# Patient Record
Sex: Male | Born: 1965
Health system: Southern US, Community
[De-identification: ages and names within clinical notes are randomized; demographics above are authoritative.]

## PROBLEM LIST (undated history)

## (undated) DIAGNOSIS — E119 Type 2 diabetes mellitus without complications: Secondary | ICD-10-CM

## (undated) DIAGNOSIS — M869 Osteomyelitis, unspecified: Secondary | ICD-10-CM

## (undated) HISTORY — DX: Type 2 diabetes mellitus without complications: E11.9

## (undated) HISTORY — DX: Osteomyelitis, unspecified: M86.9

---

## 2007-06-15 IMAGING — CR PA
1 series · 1 of 1 positions shown · non-contrast
Comparison: none

NAME: LEWACK, MATHEBULA

REASON: CHF N/A
PA AND LATERAL CHEST
The current exam is compared to the prior exam of yesterday with no
significant interval changes seen. A LEFT pleural effusion is again
noted. The effusion may be slightly less prominent but any change is
minimal. No new pulmonary infiltrates are identified. Cardiomegaly is
again seen.

[view not recorded]
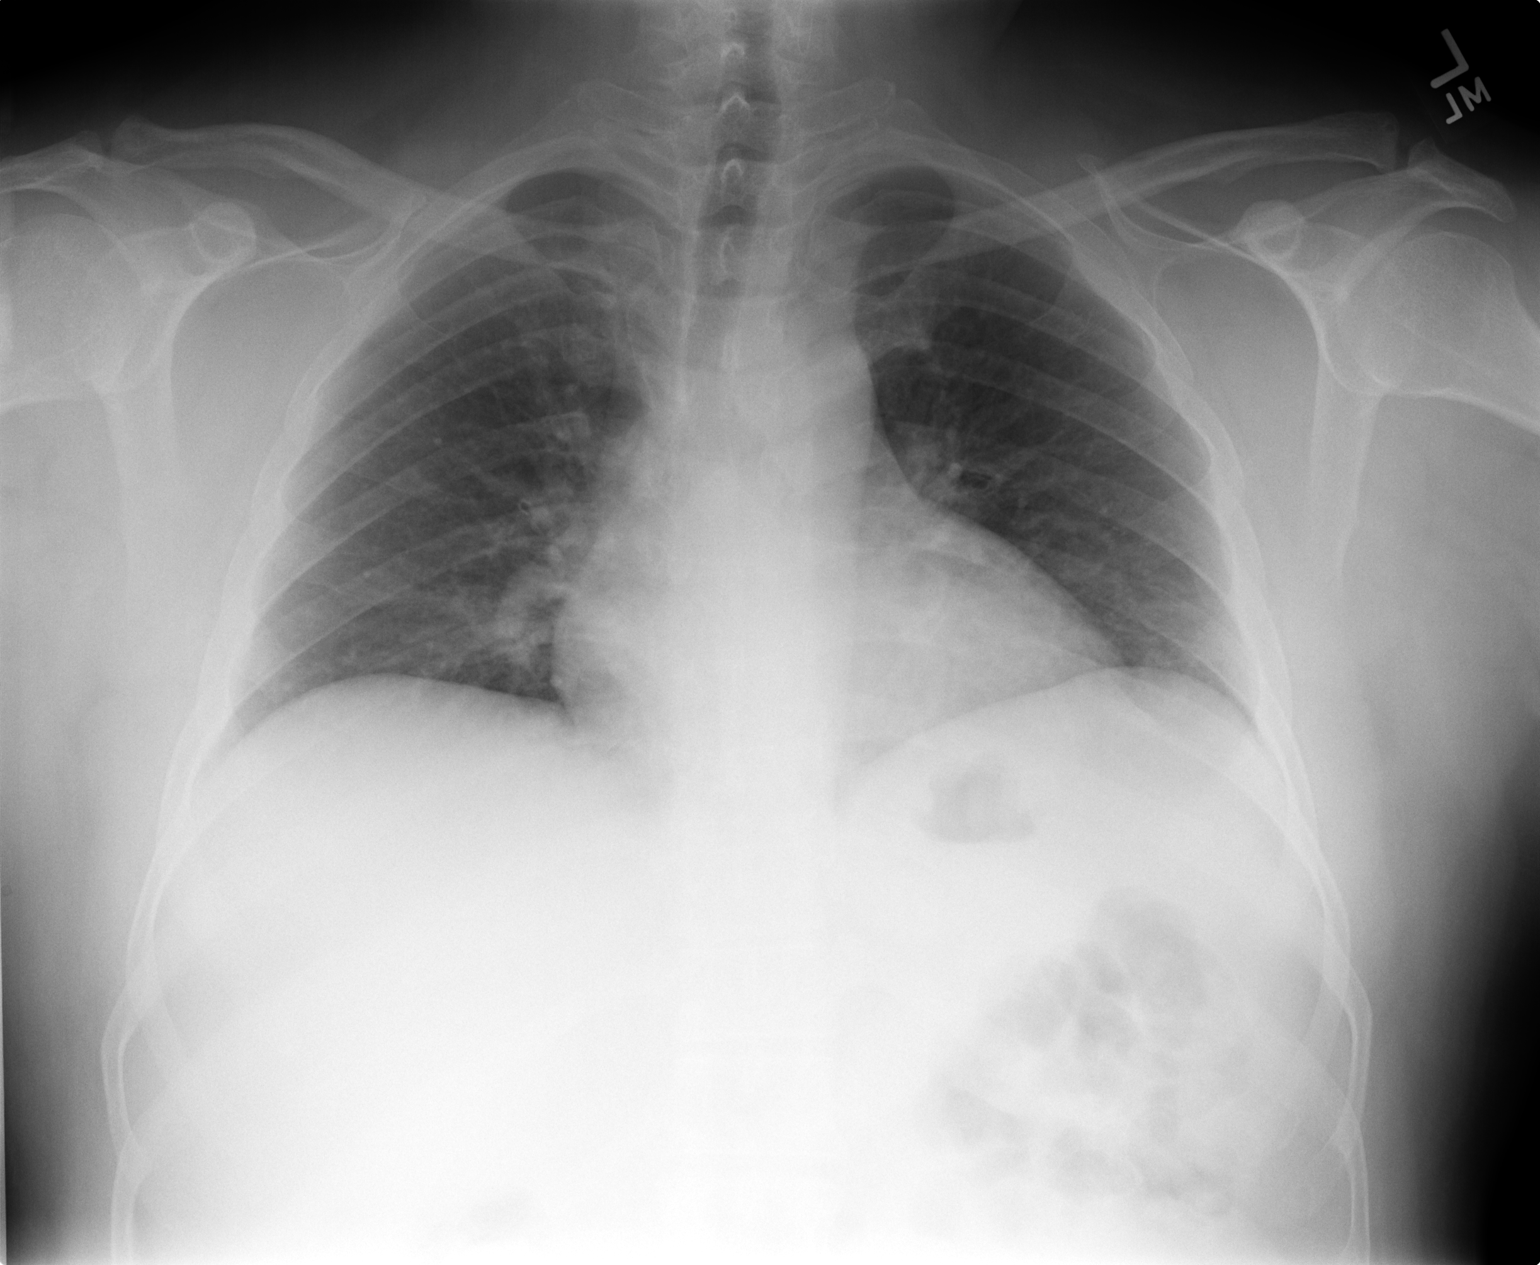

[1 of 1 positions shown; findings below may reference images not displayed]

CONCLUSION: 1. No new pulmonary infiltrates are identified as compared to the exam
of yesterday.

2. Persistent LEFT pleural effusion which appears slightly less
prominent than on the prior exam.

NAME: LEWACK, MATHEBULA

3. Persistent cardiomegaly. The general appearance of the chest
suggests low-grade congestive failure.

## 2013-10-04 ENCOUNTER — Ambulatory Visit (INDEPENDENT_AMBULATORY_CARE_PROVIDER_SITE_OTHER): Payer: BC Managed Care – PPO | Admitting: Family Medicine

## 2013-10-04 VITALS — BP 122/72 | HR 70 | Temp 97.6°F | Resp 16 | Ht 63.75 in | Wt 190.6 lb

## 2013-10-04 DIAGNOSIS — E119 Type 2 diabetes mellitus without complications: Secondary | ICD-10-CM

## 2013-10-04 DIAGNOSIS — Z125 Encounter for screening for malignant neoplasm of prostate: Secondary | ICD-10-CM

## 2013-10-04 DIAGNOSIS — Z0000 Encounter for general adult medical examination without abnormal findings: Secondary | ICD-10-CM

## 2013-10-04 DIAGNOSIS — Z130 Encounter for screening for diseases of the blood and blood-forming organs and certain disorders involving the immune mechanism: Secondary | ICD-10-CM

## 2013-10-04 DIAGNOSIS — Z1211 Encounter for screening for malignant neoplasm of colon: Secondary | ICD-10-CM

## 2013-10-04 DIAGNOSIS — Z13220 Encounter for screening for lipoid disorders: Secondary | ICD-10-CM

## 2013-10-04 LAB — POCT CBC
Granulocyte percent: 56 % (ref 37–80)
HCT, POC: 48 % (ref 43.5–53.7)
Hemoglobin: 16.4 g/dL (ref 14.1–18.1)
Lymph, poc: 3 (ref 0.6–3.4)
MCH, POC: 30.4 pg (ref 27–31.2)
MCHC: 34.2 g/dL (ref 31.8–35.4)
MCV: 88.8 fL (ref 80–97)
MID (cbc): 0.5 (ref 0–0.9)
MPV: 8.7 fL (ref 0–99.8)
POC Granulocyte: 4.4 (ref 2–6.9)
POC LYMPH PERCENT: 38.3 %L (ref 10–50)
POC MID %: 5.7 % (ref 0–12)
Platelet Count, POC: 204 10*3/uL (ref 142–424)
RBC: 5.41 M/uL (ref 4.69–6.13)
RDW, POC: 12.1 %
WBC: 7.9 10*3/uL (ref 4.6–10.2)

## 2013-10-04 LAB — POCT GLYCOSYLATED HEMOGLOBIN (HGB A1C): Hemoglobin A1C: 10.2

## 2013-10-04 MED ORDER — METFORMIN HCL 1000 MG PO TABS: 1000.0000 mg | ORAL_TABLET | Freq: Two times a day (BID) | ORAL | Status: DC

## 2013-10-04 NOTE — Progress Notes (Signed)
Chief Complaint:  Chief Complaint  Patient presents with  . Annual Exam    HPI: Ethan [name] is a 48 y.o. male who is here for  CPE He has had DM for 5 years and is on metformin 500 mg BID  He is doing well no SEs, no neuropathy, he has not seen a eye doctor  He has no complaints.  HE had a tetanus vaccine in 2010 HE ahs never had colonscopy.    Past Medical History  Diagnosis Date  . Diabetes mellitus without complication    History reviewed. No pertinent past surgical history. History   Social History  . Marital Status: Married    Spouse Name: N/A    Number of Children: N/A  . Years of Education: N/A   Social History Main Topics  . Smoking status: Never Smoker   . Smokeless tobacco: Never Used  . Alcohol Use: No  . Drug Use: No  . Sexual Activity: None   Other Topics Concern  . None   Social History Narrative  . None   Family History  Problem Relation Age of Onset  . Cancer Father    No Known Allergies Prior to Admission medications   Medication Sig Start Date End Date Taking? Authorizing Provider  metFORMIN (GLUCOPHAGE) 500 MG tablet Take 500 mg by mouth 2 (two) times daily with a meal.   Yes Historical Provider, MD     ROS: The patient denies fevers, chills, night sweats, unintentional weight loss, chest pain, palpitations, wheezing, dyspnea on exertion, nausea, vomiting, abdominal pain, dysuria, hematuria, melena, numbness, weakness, or tingling.   All other systems have been reviewed and were otherwise negative with the exception of those mentioned in the HPI and as above.    PHYSICAL EXAM: Filed Vitals:   10/04/13 1004  BP: 122/72  Pulse: 70  Temp: 97.6 F (36.4 C)  Resp: 16   Filed Vitals:   10/04/13 1004  Height: 5' 3.75" (1.619 m)  Weight: 190 lb 9.6 oz (86.456 kg)   Body mass index is 32.98 kg/(m^2).  General: Alert, no acute distress HEENT:  Normocephalic, atraumatic, oropharynx patent. EOMI, PERRLA, fundo exam  normal Cardiovascular:  Regular rate and rhythm, no rubs murmurs or gallops.  No Carotid bruits, radial pulse intact. No pedal edema.  Respiratory: Clear to auscultation bilaterally.  No wheezes, rales, or rhonchi.  No cyanosis, no use of accessory musculature GI: No organomegaly, abdomen is soft and non-tender, positive bowel sounds.  No masses. Skin: No rashes. Neurologic: Facial musculature symmetric. Psychiatric: Patient is appropriate throughout our interaction. Lymphatic: No cervical lymphadenopathy Musculoskeletal: Gait intact. GU-testicular and prostate exam normal  I deferred hemosure sicne will refer him for colonscopy   LABS: Results for orders placed in visit on 10/04/13  POCT CBC      Result Value Ref Range   WBC 7.9  4.6 - 10.2 K/uL   Lymph, poc 3.0  0.6 - 3.4   POC LYMPH PERCENT 38.3  10 - 50 %L   MID (cbc) 0.5  0 - 0.9   POC MID % 5.7  0 - 12 %M   POC Granulocyte 4.4  2 - 6.9   Granulocyte percent 56.0  37 - 80 %G   RBC 5.41  4.69 - 6.13 M/uL   Hemoglobin 16.4  14.1 - 18.1 g/dL   HCT, POC 16.148.0  09.643.5 - 53.7 %   MCV 88.8  80 - 97 fL   MCH, POC 30.4  27 - 31.2 pg   MCHC 34.2  31.8 - 35.4 g/dL   RDW, POC 16.1     Platelet Count, POC 204  142 - 424 K/uL   MPV 8.7  0 - 99.8 fL  POCT GLYCOSYLATED HEMOGLOBIN (HGB A1C)      Result Value Ref Range   Hemoglobin A1C 10.2       EKG/XRAY:   Primary read interpreted by Dr. Conley Rolls at [facility]   ASSESSMENT/PLAN: Encounter Diagnoses  Name Primary?  . Annual physical exam Yes  . Type II or unspecified type diabetes mellitus without mention of complication, not stated as uncontrolled   . Screening for hyperlipidemia   . Screening for deficiency anemia   . Screening for colon cancer   . Screening for prostate cancer    Annual labs pending Refer to GI for colonscopy Will increase his metfromin from 500 mg BID to 1 gram BID due to increase A1c Recommend : ADA diet, BP goal <140/90, daily foot exams, tobacco cessation if  smoking, annual eye exam, annual flu vaccine, PNA vaccine if age and time appropriate. . F/u in 3 month  Gross sideeffects, risk and benefits, and alternatives of medications d/w patient. Patient is aware that all medications have potential sideeffects and we are unable to predict every sideeffect or drug-drug interaction that may occur.  Hamilton Capri PHUONG, DO 10/04/2013 12:27 PM

## 2013-10-04 NOTE — Patient Instructions (Signed)
Diabetes y normas bsicas de atencin mdica (Diabetes and Standards of Medical Care) La diabetes es una enfermedad complicada. El equipo que trate su diabetes deber incluir un nutricionista, un enfermero, un educador para la diabetes, un oftalmlogo y ms. Para que todas las personas conozcan sobre su enfermedad y para que los pacientes tengan los cuidados que necesitan, se crearon las siguientes normas bsicas para un mejor control. A continuacin se indican los estudios, vacunas, medicamentos, educacin y planes que necesitar. Prueba de HbA1c Esta prueba muestra cmo ha sido controlada su glucosa en los ltimos 2 o 3 meses. Se utiliza para verificar si el plan de control de la diabetes debe ser ajustado.   Hgalos al menos 2 veces al ao si cumple los objetivos del tratamiento.  Si Alexi Geibel han cambiado el tratamiento o si no cumple con los objetivos del tratamiento, debe hacerlo 4 veces al ao. Control de la presin arterial.  Hgalas en cada visita mdica de rutina. El objetivo es tener menos de 140/90 mm Hg en la mayora de las personas, pero 130/80 mm Hg en algunos casos. Consulte a su mdico acerca de su objetivo. Examen dental.  Concurra regularmente a las visitas de control con el dentista. Examen ocular.  Si Oreatha Fabry diagnosticaron diabetes tipo 1 siendo un nio, debe hacerse estudios al llegar a los 10 aos o ms y si ha sufrido de diabetes durante 3 a 5 aos. Se recomienda hacer anualmente los exmenes oculares despus de ese examen inicial.  Si Golden Gilreath diagnosticaron diabetes tipo 1 siendo adulto, hgase un examen dentro de los 5 aos del diagnstico y luego una vez por ao.  Si Sheketa Ende diagnosticaron diabetes tipo 2, hgase un estudio lo antes posible despus del diagnstico y luego una vez por ao. Examen de los pies  Se har una inspeccin visual en cada visita mdica de rutina. Estos controles observarn si hay cortes, lesiones u otros problemas en los pies.  Una vez por ao debe hacerse un  examen ms intensivo. Este examen incluye la inspeccin visual y una evaluacin de los pulsos del pie y la sensibilidad.  Contrlese los pies todas las noches para ver si hay cortes, lesiones u otros problemas. Comunquese con su mdico si observa que no se curan. Estudio de la funcin renal (microalbmina en orina)  Debe realizarse una vez por ao.  Diabetes tipo 1: La primera prueba se realiza a los 5 aos despus del diagnstico.  Diabetes tipo2: La primera prueba se realiza en el momento del diagnstico.  La creatinina srica y el ndice de filtracin glomerular estimada (eGFR, por sus siglas en ingls) se realizan una vez por ao para informar el nivel de enfermedad renal crnica, si la hubiera. Perfil lipdico (colesterol, HDL, LDL, triglicridos).  La mayora de las personas lo hacen cada 5 aos.  En relacin al LDL, el objetivo es tener menos de 100 mg/dl. Si tiene alto riesgo, el objetivo es tener menos de 70 mg/dl.  En relacin al HDL, el objetivo es tener entre 40 y 50 mg/dl para los hombres y entre 50 y 60 mg/dl para las mujeres. Un nivel de colesterol HDL de 60 mg/dl o superior da una cierta proteccin contra la enfermedad cardaca.  En relacin a los triglicridos, el objetivo es tener menos de 150 mg/dl. Vacuna contra la gripe, contra el neumococo y contra la hepatitis B  Se recomienda aplicarse la vacuna contra la gripe anualmente.  Se recomienda que las personas mayores de 65aos con diabetes se apliquen la   vacuna contra la neumona. En algunos casos, pueden administrarse dos inyecciones separadas. Pregntele al mdico si tiene la vacuna contra la neumona al da.  Tambin se recomienda la aplicacin de la vacuna contra la hepatitis B en los adultos con diabetes. Educacin para el autocontrol de la diabetes  Recomendaciones al momento del diagnstico y los controles segn sea necesario. Plan de tratamiento  Su plan de tratamiento ser revisado en cada visita  mdica. Document Released: 05/15/2009 Document Revised: 07/05/2013 [facility] Patient Information 2015 [place]. This information is not intended to replace advice given to you by your health care provider. Make sure you discuss any questions you have with your health care provider.

## 2013-10-05 LAB — COMPLETE METABOLIC PANEL WITH GFR
ALT: 18 U/L (ref 0–53)
BUN: 13 mg/dL (ref 6–23)
CO2: 16 mEq/L — ABNORMAL LOW (ref 19–32)
Chloride: 98 mEq/L (ref 96–112)
Creat: 0.7 mg/dL (ref 0.50–1.35)
GFR, Est Non African American: >89 mL/min
Glucose, Bld: 291 mg/dL — ABNORMAL HIGH (ref 70–99)
Potassium: 4.5 mEq/L (ref 3.5–5.3)
Total Protein: 6.9 g/dL (ref 6.0–8.3)

## 2013-10-05 LAB — LIPID PANEL
Cholesterol: 265 mg/dL — ABNORMAL HIGH (ref 0–200)
HDL: 33 mg/dL — ABNORMAL LOW (ref >39)
Total CHOL/HDL Ratio: 8 Ratio
Triglycerides: 1151 mg/dL — ABNORMAL HIGH (ref <150)

## 2013-10-05 LAB — COMPLETE METABOLIC PANEL WITHOUT GFR
AST: 13 U/L (ref 0–37)
Albumin: 4.1 g/dL (ref 3.5–5.2)
Alkaline Phosphatase: 70 U/L (ref 39–117)
Calcium: 9.6 mg/dL (ref 8.4–10.5)
GFR, Est African American: >89 mL/min
Sodium: 133 meq/L — ABNORMAL LOW (ref 135–145)
Total Bilirubin: 0.6 mg/dL (ref 0.2–1.2)

## 2013-10-05 LAB — TSH: TSH: 1.803 u[IU]/mL (ref 0.350–4.500)

## 2013-10-05 LAB — PSA: PSA: 0.16 ng/mL (ref <=4.00)

## 2013-10-06 ENCOUNTER — Telehealth: Payer: Self-pay | Admitting: Family Medicine

## 2013-10-06 ENCOUNTER — Encounter: Payer: Self-pay | Admitting: Family Medicine

## 2013-10-06 MED ORDER — SIMVASTATIN 40 MG PO TABS: 40.0000 mg | ORAL_TABLET | Freq: Every day | ORAL | Status: DC

## 2013-10-06 NOTE — Telephone Encounter (Signed)
[phone] is his preferred number, his wife gave it to me  D/w patient his recent labs. Will return in 2-3 months for cholesterol and diabetes recheck.  Will start him on simvastatin 40 mg daily

## 2014-01-21 ENCOUNTER — Ambulatory Visit (INDEPENDENT_AMBULATORY_CARE_PROVIDER_SITE_OTHER): Payer: BC Managed Care – PPO | Admitting: Family Medicine

## 2014-01-21 ENCOUNTER — Encounter: Payer: Self-pay | Admitting: Family Medicine

## 2014-01-21 VITALS — BP 122/78 | HR 84 | Temp 98.2°F | Resp 16 | Ht 63.25 in | Wt 196.4 lb

## 2014-01-21 DIAGNOSIS — E785 Hyperlipidemia, unspecified: Secondary | ICD-10-CM

## 2014-01-21 DIAGNOSIS — N529 Male erectile dysfunction, unspecified: Secondary | ICD-10-CM

## 2014-01-21 DIAGNOSIS — E118 Type 2 diabetes mellitus with unspecified complications: Secondary | ICD-10-CM

## 2014-01-21 LAB — GLUCOSE, POCT (MANUAL RESULT ENTRY): POC Glucose: 186 mg/dL — AB (ref 70–99)

## 2014-01-21 LAB — LIPID PANEL
CHOLESTEROL: 161 mg/dL (ref 0–200)
HDL: 25 mg/dL — AB (ref >39)
Total CHOL/HDL Ratio: 6.4 Ratio
Triglycerides: 772 mg/dL — ABNORMAL HIGH (ref <150)

## 2014-01-21 LAB — COMPREHENSIVE METABOLIC PANEL
ALT: 17 U/L (ref 0–53)
AST: 12 U/L (ref 0–37)
Albumin: 4.2 g/dL (ref 3.5–5.2)
Alkaline Phosphatase: 60 U/L (ref 39–117)
BILIRUBIN TOTAL: 0.2 mg/dL (ref 0.2–1.2)
BUN: 15 mg/dL (ref 6–23)
CALCIUM: 8.9 mg/dL (ref 8.4–10.5)
CHLORIDE: 103 meq/L (ref 96–112)
CO2: 27 meq/L (ref 19–32)
Creat: 0.82 mg/dL (ref 0.50–1.35)
Glucose, Bld: 162 mg/dL — ABNORMAL HIGH (ref 70–99)
Potassium: 4.4 mEq/L (ref 3.5–5.3)
Sodium: 138 mEq/L (ref 135–145)
Total Protein: 6.8 g/dL (ref 6.0–8.3)

## 2014-01-21 LAB — POCT GLYCOSYLATED HEMOGLOBIN (HGB A1C): Hemoglobin A1C: 6.5

## 2014-01-21 MED ORDER — SIMVASTATIN 40 MG PO TABS: 40.0000 mg | ORAL_TABLET | Freq: Every day | ORAL | Status: DC

## 2014-01-21 MED ORDER — SILDENAFIL CITRATE 100 MG PO TABS: ORAL_TABLET | ORAL | Status: DC

## 2014-01-21 NOTE — Progress Notes (Addendum)
IDENTIFYING INFORMATION  Ethan [name] / DOB: 1965-12-04 / MRN: [id number]  The patient  does not have a problem list on [name].  SUBJECTIVE  Chief Complaint: Follow-up   History of present illness: Ethan [name] is a 48 y.o. year old male who presents for a diabetes and cholesterol followup.  He has been taking Metformin 1 gram in the morning and 1 gram. He denies urinary frequency and urgency at this time, and reports he has lost roughly 5 lbs since his last visit. He denies sock and glove paraesthesia.  He has been taking 40 mg of simvastatin nightly.  He denies muscular pain and RUQ pain.  He reports erectile dysfunction n that he can not achieve an erection for the last eight months. He reports that his relationship with his wife is good.  He denies urinary difficulty, and reports that past prostate exams have been "good."  He  has a past medical history of Diabetes mellitus without complication.    He has a current medication list which includes the following prescription(s): metformin and simvastatin.  Ethan [name] has No Known Allergies. He  reports that he has never smoked. He has never used smokeless tobacco. He reports that he does not drink alcohol or use illicit drugs. He  has no sexual activity history on [name].  The patient  has no past surgical history on [name].  His family history includes Cancer in his father.  Review of Systems  Constitutional: Negative.   HENT: Negative.   Eyes: Negative.   Respiratory: Negative.   Cardiovascular: Negative.   Gastrointestinal: Negative.   Genitourinary: Negative.   Musculoskeletal: Negative for myalgias.  Skin: Negative.   Neurological: Negative for dizziness.  Endo/Heme/Allergies: Negative for polydipsia.  Psychiatric/Behavioral: Negative.     OBJECTIVE  Blood pressure 122/78, pulse 84, temperature 98.2 F (36.8 C), temperature source Oral, resp. rate 16, height 5' 3.25" (1.607 m), weight 196 lb 6.4 oz (89.086 kg), SpO2 99  %. The patient's body mass index is 34.5 kg/(m^2).  Physical Exam  Constitutional: He is oriented to person, place, and time. He appears well-developed and well-nourished.  HENT:  Head: Normocephalic.  Right Ear: External ear normal.  Left Ear: External ear normal.  Mouth/Throat: No oropharyngeal exudate.  Eyes: Conjunctivae and EOM are normal. Pupils are equal, round, and reactive to light.  Neck: Normal range of motion.  Cardiovascular: Normal rate, regular rhythm and normal heart sounds.  Exam reveals no gallop and no friction rub.   No murmur heard. Respiratory: Effort normal and breath sounds normal.  GI: Soft. Bowel sounds are normal.  Musculoskeletal: Normal range of motion.  Neurological: He is alert and oriented to person, place, and time. He has normal reflexes.  Skin: Skin is warm, dry and intact.  Psychiatric: He has a normal mood and affect. His behavior is normal. Judgment and thought content normal.   Monofilament testing: Negative for sensation deficits.  Results for Ethan [name], Ethan [name] (MRN [id number]) as of 01/21/2014 16:31  Ref. Range 10/04/2013 11:28 01/21/2014 16:27  Hgb A1c MFr Bld No range found 10.2 6.5    ASSESSMENT & PLAN  Ethan [name] was seen today for follow-up.  Diagnoses and associated orders for this visit:  Diabetes mellitus type 2 with complications - POCT glucose (manual entry) - POCT glycosylated hemoglobin (Hb A1C) - Ambulatory referral to Ophthalmology (Addendum on 02/08/2014: Patient has not picked up the phone when referral department calls, and no voicemail has been set up.  Therefore no appointment has  been made.  This should be discussed at next follow up.)   Dyslipidemia - simvastatin (ZOCOR) 40 MG tablet; Take 1 tablet (40 mg total) by mouth at bedtime. Return in 2.5 months for recheck. - Comprehensive metabolic panel - Lipid panel  Erectile dysfunction, unspecified erectile dysfunction type - sildenafil (VIAGRA) 100 MG tablet; Take 30 minutes  to an hour before sex.  Monitor for low blood pressure.     The patient was instructed to to call or comeback to clinic as needed, or should symptoms warrant.  Deliah BostonMichael Clark, MHS, PA-C [facility] 01/21/2014 5:13 PM

## 2014-01-24 NOTE — Progress Notes (Signed)
Agree with A/p.  Teststerone level at this point would be inaccurate since need to be drawn early AM If  he has concerns for hypogonadism related to low Testosterone level then will need to come in for blood work in the AM for better accuracy.  Dr Conley RollsLe

## 2014-01-25 ENCOUNTER — Telehealth: Payer: Self-pay | Admitting: Physician Assistant

## 2014-01-25 DIAGNOSIS — E781 Pure hyperglyceridemia: Secondary | ICD-10-CM

## 2014-01-25 MED ORDER — FENOFIBRATE 120 MG PO TABS: 120.0000 mg | ORAL_TABLET | Freq: Every day | ORAL | Status: DC

## 2014-01-25 NOTE — Telephone Encounter (Signed)
Opened unintentionally. Deliah BostonMichael Shyhiem Beeney, MS, PA-C   11:45 AM, 01/25/2014

## 2014-01-25 NOTE — Telephone Encounter (Signed)
-----   Message from Lenell Antuhao P Le, DO sent at 01/25/2014 10:11 AM EST ----- Go ahead and add the fenofibrate daily . I usually use medium dose Tricor. RBing him back in 3 months for recheck.   Thanks,  Tle  ----- Message -----    From: Dolores LoryMichael Lee Vergil Burby, PA-C    Sent: 01/24/2014   9:55 PM      To: Thao P Le, DO  Good evening Dr. Conley RollsLe.  I wanted to share Mr. Seat's cholesterol panel with you.  His triglycerides remain high despite starting  40 mg sivastatin 3 months ago. I am happy to write him for fil oil or a fenofibrate if you would like to treat.  Please advise. Deliah BostonMichael Zarina Pe, MS, PA-C   9:53 PM, 01/24/2014

## 2014-01-25 NOTE — Telephone Encounter (Signed)
Patient started on medium dose Tricor for hypertriglyceridemia.  Tried to call patient to inform him of prescription.  Unable to leave message.  Will route to lab staff to inform him of abnormal results and new medication. Will also ask that he be rescheduled for follow in three months.    Deliah BostonMichael Sherry Blackard, MS, PA-C   11:24 AM, 01/25/2014

## 2014-03-31 ENCOUNTER — Other Ambulatory Visit: Payer: Self-pay | Admitting: Family Medicine

## 2014-06-18 ENCOUNTER — Ambulatory Visit (INDEPENDENT_AMBULATORY_CARE_PROVIDER_SITE_OTHER): Payer: BLUE CROSS/BLUE SHIELD | Admitting: Internal Medicine

## 2014-06-18 VITALS — BP 112/74 | HR 76 | Temp 98.1°F | Resp 20 | Ht 63.5 in | Wt 196.0 lb

## 2014-06-18 DIAGNOSIS — E781 Pure hyperglyceridemia: Secondary | ICD-10-CM | POA: Diagnosis not present

## 2014-06-18 DIAGNOSIS — E118 Type 2 diabetes mellitus with unspecified complications: Secondary | ICD-10-CM

## 2014-06-18 DIAGNOSIS — R220 Localized swelling, mass and lump, head: Secondary | ICD-10-CM

## 2014-06-18 DIAGNOSIS — E119 Type 2 diabetes mellitus without complications: Secondary | ICD-10-CM

## 2014-06-18 DIAGNOSIS — E1165 Type 2 diabetes mellitus with hyperglycemia: Secondary | ICD-10-CM | POA: Insufficient documentation

## 2014-06-18 LAB — POCT CBC
Granulocyte percent: 67.4 %G (ref 37–80)
HEMATOCRIT: 45.7 % (ref 43.5–53.7)
HEMOGLOBIN: 15.9 g/dL (ref 14.1–18.1)
Lymph, poc: 2.9 (ref 0.6–3.4)
MCH, POC: 29.7 pg (ref 27–31.2)
MCHC: 34.8 g/dL (ref 31.8–35.4)
MCV: 85.3 fL (ref 80–97)
MID (CBC): 0.6 (ref 0–0.9)
MPV: 8.2 fL (ref 0–99.8)
POC GRANULOCYTE: 7.1 — AB (ref 2–6.9)
POC LYMPH %: 27.2 % (ref 10–50)
POC MID %: 5.4 %M (ref 0–12)
Platelet Count, POC: 271 10*3/uL (ref 142–424)
RBC: 5.36 M/uL (ref 4.69–6.13)
RDW, POC: 12.6 %
WBC: 10.5 10*3/uL — AB (ref 4.6–10.2)

## 2014-06-18 LAB — COMPREHENSIVE METABOLIC PANEL
ALT: 15 U/L (ref 0–53)
AST: 10 U/L (ref 0–37)
Albumin: 4 g/dL (ref 3.5–5.2)
Alkaline Phosphatase: 96 U/L (ref 39–117)
BUN: 11 mg/dL (ref 6–23)
CO2: 26 mEq/L (ref 19–32)
Calcium: 9.3 mg/dL (ref 8.4–10.5)
Chloride: 99 mEq/L (ref 96–112)
Creat: 0.72 mg/dL (ref 0.50–1.35)
GLUCOSE: 243 mg/dL — AB (ref 70–99)
POTASSIUM: 4.9 meq/L (ref 3.5–5.3)
Sodium: 135 mEq/L (ref 135–145)
TOTAL PROTEIN: 7.3 g/dL (ref 6.0–8.3)
Total Bilirubin: 0.6 mg/dL (ref 0.2–1.2)

## 2014-06-18 LAB — LIPID PANEL
CHOL/HDL RATIO: 5.6 ratio
Cholesterol: 167 mg/dL (ref 0–200)
HDL: 30 mg/dL — AB (ref >=40)
Triglycerides: 472 mg/dL — ABNORMAL HIGH (ref <150)

## 2014-06-18 LAB — POCT GLYCOSYLATED HEMOGLOBIN (HGB A1C): HEMOGLOBIN A1C: 8.4

## 2014-06-18 MED ORDER — METFORMIN HCL 1000 MG PO TABS: ORAL_TABLET | ORAL | Status: DC

## 2014-06-18 MED ORDER — CETIRIZINE HCL 10 MG PO TABS: ORAL_TABLET | ORAL | Status: DC

## 2014-06-18 NOTE — Progress Notes (Signed)
Subjective:  This chart was scribed for Ethan Siaobert Khiana Camino MD,  by Ethan [name],scribe, at [facility].  This patient was seen in room 12 and the patient's care was started at 11:33 AM.   Chief Complaint  Patient presents with  . Facial Swelling    left side since yesterday  . Medication Refill    Metformin     Patient ID: Ethan [name], male    DOB: 1965-11-07, 49 y.o.   MRN: [id number]  HPI  HPI Comments: Ethan [name] is a 49 y.o. male who presents to [facility] for left sided facial swelling onset yesterday.  He has associated symptoms of watery eyes and rhinorrhea. He is having difficulty smiling due to the swelling. Denies any itching, difficulty swallowing, change in vision. This started yesterday after he cleaned his Lawnmower following mowing the grass.   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  He has no other complaints today.    Diabetes: He has missed his follow-up visit. When he was last here he had elevated triglycerides and was ordered to take Tricor which he is compliant with.  He has remained on the thousand milligrams metformin twice a day. He has no peripheral sensory complaints    Past Medical History  Diagnosis Date  . Diabetes mellitus without complication     Current Outpatient Prescriptions on File Prior to Visit  Medication Sig Dispense Refill  . Fenofibrate 120 MG TABS Take 1 tablet (120 mg total) by mouth daily. 90 tablet 1  . metFORMIN (GLUCOPHAGE) 1000 MG tablet TAKE 1 TABLET BY MOUTH TWICE DAILY.  "NEEDS OFFICE VISIT FOR ADDITIONAL REFILLS" 60 tablet 0  . sildenafil (VIAGRA) 100 MG tablet Take 30 minutes to an hour before sex.  Monitor for low blood pressure. 5 tablet 2  . simvastatin (ZOCOR) 40 MG tablet Take 1 tablet (40 mg total) by mouth at bedtime. Return in 2.5 months for recheck. 90 tablet 0   No current facility-administered medications on file prior to visit.    No Known Allergies  Review of  Systems  Constitutional: Negative for fever, chills, activity change, appetite change, fatigue and unexpected weight change.  HENT: Positive for facial swelling and rhinorrhea. Negative for trouble swallowing.   Eyes: Negative for visual disturbance.  Respiratory: Negative for shortness of breath.   Cardiovascular: Negative for chest pain, palpitations and leg swelling.  Gastrointestinal: Negative for nausea and vomiting.       Objective:   Physical Exam  Constitutional: He is oriented to person, place, and time. He appears well-developed and well-nourished. No distress.  HENT:  Head: Normocephalic and atraumatic.  Left Ear: External ear normal.  Nose: Nose normal.  Mouth/Throat: Oropharynx is clear and moist.  There is no swelling of the tongue.   Eyes: Conjunctivae and EOM are normal. Pupils are equal, round, and reactive to light.  Neck: Normal range of motion. Neck supple. No thyromegaly present.  Cardiovascular: Normal rate.   Pulmonary/Chest: Effort normal. No respiratory distress.  Musculoskeletal: Normal range of motion.  Lymphadenopathy:    He has no cervical adenopathy.  Neurological: He is alert and oriented to person, place, and time.  Mild decrease in movement of the left corner of the mouth with smiling. Eyebrow elevation intact and no ptosis.   There is no asymmetry of his mouth with talking  Skin: Skin is warm and dry.  Skin of the face appears normal with very mild swelling of the left upper  lip. No vesicles or rash. He feels that the swelling extends into the left malar area but there is no loss of symmetry noted.  Psychiatric: He has a normal mood and affect. His behavior is normal.  Nursing note and vitals reviewed.  BP 112/74 mmHg  Pulse 76  Temp(Src) 98.1 F (36.7 C) (Oral)  Resp 20  Ht 5' 3.5" (1.613 m)  Wt 196 lb (88.905 kg)  BMI 34.17 kg/m2  SpO2 98%  Results for orders placed or performed in visit on 06/18/14  POCT glycosylated hemoglobin (Hb  A1C)  Result Value Ref Range   Hemoglobin A1C 8.4   POCT CBC  Result Value Ref Range   WBC 10.5 (A) 4.6 - 10.2 K/uL   Lymph, poc 2.9 0.6 - 3.4   POC LYMPH PERCENT 27.2 10 - 50 %L   MID (cbc) 0.6 0 - 0.9   POC MID % 5.4 0 - 12 %M   POC Granulocyte 7.1 (A) 2 - 6.9   Granulocyte percent 67.4 37 - 80 %G   RBC 5.36 4.69 - 6.13 M/uL   Hemoglobin 15.9 14.1 - 18.1 g/dL   HCT, POC 16.1 09.6 - 53.7 %   MCV 85.3 80 - 97 fL   MCH, POC 29.7 27 - 31.2 pg   MCHC 34.8 31.8 - 35.4 g/dL   RDW, POC 04.5 %   Platelet Count, POC 271 142 - 424 K/uL   MPV 8.2 0 - 99.8 fL         Assessment & Plan:  I have completed the patient encounter in its entirety as documented by the scribe, with editing by me where necessary. Ethan [name] P. Merla Riches, M.D.  Swelling of upper lip--- although Bell's palsy was considered there is not enough evidence to make this diagnosis and I suspect this is something allergic  Diabetes mellitus without complication - now uncontrolled //increase metformin to 2500 mg///lose weight  Hypertriglyceridemia - Plan: Lipid panel to check effects of Zocor and TriCor

## 2014-06-27 ENCOUNTER — Encounter: Payer: Self-pay | Admitting: Internal Medicine

## 2014-06-27 DIAGNOSIS — E781 Pure hyperglyceridemia: Secondary | ICD-10-CM

## 2014-06-27 MED ORDER — FENOFIBRATE 120 MG PO TABS: 120.0000 mg | ORAL_TABLET | Freq: Every day | ORAL | Status: DC

## 2015-04-08 LAB — HM DIABETES EYE EXAM

## 2015-05-02 ENCOUNTER — Encounter: Payer: Self-pay | Admitting: Internal Medicine

## 2016-11-02 DIAGNOSIS — M869 Osteomyelitis, unspecified: Secondary | ICD-10-CM

## 2016-11-02 HISTORY — DX: Osteomyelitis, unspecified: M86.9

## 2016-11-24 DIAGNOSIS — E11628 Type 2 diabetes mellitus with other skin complications: Secondary | ICD-10-CM | POA: Insufficient documentation

## 2016-11-24 DIAGNOSIS — L03119 Cellulitis of unspecified part of limb: Secondary | ICD-10-CM

## 2016-11-29 ENCOUNTER — Telehealth: Payer: Self-pay | Admitting: General Practice

## 2016-11-29 ENCOUNTER — Encounter: Payer: Self-pay | Admitting: Family Medicine

## 2016-11-29 ENCOUNTER — Ambulatory Visit (INDEPENDENT_AMBULATORY_CARE_PROVIDER_SITE_OTHER): Payer: BLUE CROSS/BLUE SHIELD | Admitting: Family Medicine

## 2016-11-29 VITALS — HR 95 | Temp 98.8°F | Resp 18 | Ht 64.0 in | Wt 182.0 lb

## 2016-11-29 DIAGNOSIS — E1169 Type 2 diabetes mellitus with other specified complication: Secondary | ICD-10-CM | POA: Diagnosis not present

## 2016-11-29 DIAGNOSIS — IMO0002 Reserved for concepts with insufficient information to code with codable children: Secondary | ICD-10-CM

## 2016-11-29 DIAGNOSIS — Z09 Encounter for follow-up examination after completed treatment for conditions other than malignant neoplasm: Secondary | ICD-10-CM

## 2016-11-29 DIAGNOSIS — M869 Osteomyelitis, unspecified: Secondary | ICD-10-CM

## 2016-11-29 DIAGNOSIS — E1165 Type 2 diabetes mellitus with hyperglycemia: Secondary | ICD-10-CM

## 2016-11-29 DIAGNOSIS — E118 Type 2 diabetes mellitus with unspecified complications: Secondary | ICD-10-CM | POA: Diagnosis not present

## 2016-11-29 LAB — POCT GLYCOSYLATED HEMOGLOBIN (HGB A1C): Hemoglobin A1C: 8.1

## 2016-11-29 MED ORDER — BLOOD GLUCOSE MONITOR KIT: PACK | 0 refills | Status: AC

## 2016-11-29 MED ORDER — GLUCOSE BLOOD VI STRP: ORAL_STRIP | 2 refills | Status: AC

## 2016-11-29 MED ORDER — LANCETS MICRO THIN 33G MISC: 2 refills | Status: AC

## 2016-11-29 MED ORDER — METFORMIN HCL ER (OSM) 1000 MG PO TB24: 1000.0000 mg | ORAL_TABLET | Freq: Every day | ORAL | 2 refills | Status: DC

## 2016-11-29 NOTE — Patient Instructions (Signed)
     IF you received an x-ray today, you will receive an invoice from [facility]. Please contact [facility] at [phone] with questions or concerns regarding your invoice.   IF you received labwork today, you will receive an invoice from [facility]. Please contact [facility] at [phone] with questions or concerns regarding your invoice.   Our billing staff will not be able to assist you with questions regarding bills from these companies.  You will be contacted with the lab results as soon as they are available. The fastest way to get your results is to activate your My Chart account. Instructions are located on the last page of this paperwork. If you have not heard from us regarding the results in 2 weeks, please contact this office.     

## 2016-11-29 NOTE — Progress Notes (Signed)
9/28/20183:34 PM  Ethan [name] 03-23-1965, 51 y.o. male [id number]  Chief Complaint  Patient presents with  . Medication Refill    metformin and glucose meter and supplies     HPI:   Patient is a 51 y.o. male with past medical history significant for DM2 non insulin dependent who presents today requesting refills of his metformin.   He was last seen for his DM about 2 years ago. Yesterday he was released from the hospital on 6 weeks oral abx treatment for osteomyelitis 2/2 DM foot ulcer, right foot.   He sees podiatry and ID next week.  He reports he used to be on metformin 1061m BID but that caused significant diarrhea. Glipizide was added in the hospital. He plans to establish care [facility]  [facility] 11/[facility]/2016  Decreased Interest 0  Down, Depressed, Hopeless 0  PHQ - 2 Score 0    No Known Allergies  Current Meds  Medication Sig  . ciprofloxacin (CIPRO) 500 MG tablet Take 500 mg by mouth.  . clindamycin (CLEOCIN) 150 MG capsule Take 450 mg by mouth every 8 (eight) hours.  .Marland KitchenglipiZIDE (GLUCOTROL) 5 MG tablet Take 5 mg by mouth 2 (two) times daily with a meal.  . lactobacillus (FLORANEX/LACTINEX) PACK Take 1 packet by mouth 3 (three) times daily.    Past Medical History:  Diagnosis Date  . Diabetes mellitus without complication ([facility]     History reviewed. No pertinent surgical history.  Social History  Substance Use Topics  . Smoking status: Never Smoker  . Smokeless tobacco: Never Used  . Alcohol use No    Family History  Problem Relation Age of Onset  . Cancer Father     Review of Systems  Constitutional: Negative for chills and fever.  Respiratory: Negative for cough and shortness of breath.   Cardiovascular: Negative for chest pain, palpitations and leg swelling.  Gastrointestinal: Negative for abdominal pain, nausea and vomiting.  Genitourinary: Negative for frequency.  Endo/Heme/Allergies: Negative for  polydipsia.     OBJECTIVE:  Pulse 95, temperature 98.8 F (37.1 C), temperature source Oral, resp. rate 18, height '5\' 4"'  (1.626 m), weight 182 lb (82.6 kg), SpO2 97 %.  Physical Exam  Constitutional: He is oriented to person, place, and time and well-developed, well-nourished, and in no distress.  HENT:  Head: Normocephalic and atraumatic.  Mouth/Throat: Oropharynx is clear and moist.  Eyes: Pupils are equal, round, and reactive to light. EOM are normal.  Neck: Neck supple.  Cardiovascular: Normal rate, regular rhythm and normal heart sounds.  Exam reveals no gallop and no friction rub.   No murmur heard. Pulmonary/Chest: Effort normal and breath sounds normal. He has no wheezes. He has no rales.  Musculoskeletal:  Right foot in clean/dry/intact dressing, wearing boot.  Neurological: He is alert and oriented to person, place, and time.  Skin: Skin is warm and dry.    Results for orders placed or performed in visit on 11/29/16 (from the past 24 hour(s))  POCT glycosylated hemoglobin (Hb A1C)     Status: None   Collection Time: 11/29/16 12:24 PM  Result Value Ref Range   Hemoglobin A1C 8.1    Normal creatinine per labs done during hospitalization.   ASSESSMENT and PLAN  1. Uncontrolled type 2 diabetes mellitus with complication, without long-term current use of insulin (HCC)  A1c not far from goal. Trial of ER metformin to aid with GI symptoms. Discussed importance of establishing with PCP within  the month. Advised checking cbgs fasting, take reading to PCP.   - POCT glycosylated hemoglobin (Hb A1C) - metformin (FORTAMET) 1000 MG (OSM) 24 hr tablet; Take 1 tablet (1,000 mg total) by mouth daily with breakfast. - blood glucose meter kit and supplies KIT; Dispense based on patient and insurance preference. Test once a day, fasting. DX E11.65, E11.8 - glucose blood test strip; For appropriate glucometer, Test once a day, fasting. DX E11.65, E11.8 - LANCETS MICRO THIN 33G MISC;  Dispense based on patient and insurance preference. Test once a day, fasting. DX E11.65, E11.8  2. Diabetic osteomyelitis ([place]) Strongly encouraged medication and fu adherence.   3. Hospital discharge follow-up  Other orders - acetaminophen (TYLENOL) 325 MG tablet; Take 650 mg by mouth. - lactobacillus (FLORANEX/LACTINEX) PACK; Take 1 packet by mouth 3 (three) times daily. - ciprofloxacin (CIPRO) 500 MG tablet; Take 500 mg by mouth. - glipiZIDE (GLUCOTROL) 5 MG tablet; Take 5 mg by mouth 2 (two) times daily with a meal.   Return in about 4 weeks (around 12/27/2016), or if symptoms worsen or fail to improve.   Rutherford Guys, MD Primary Care at [place], [place] [address] Ph.  [phone] Fax [phone]

## 2016-11-30 ENCOUNTER — Telehealth: Payer: Self-pay | Admitting: Family Medicine

## 2016-11-30 NOTE — Telephone Encounter (Signed)
Pt saw Dr. Leretha Pol yesterday and was given a new diabetic medication, but it is not covered by insurance and they need something changed -daughter is concerned about the ulcer that patient has and doing with out the medication  Best number [phone]

## 2016-12-02 NOTE — Telephone Encounter (Signed)
Please advise 

## 2016-12-03 NOTE — Telephone Encounter (Signed)
Duplicate message, I had already resolved issue and spoken with patient's daughter.

## 2017-03-01 ENCOUNTER — Telehealth: Payer: Self-pay | Admitting: Family Medicine

## 2017-03-01 ENCOUNTER — Other Ambulatory Visit: Payer: Self-pay

## 2017-03-01 DIAGNOSIS — E118 Type 2 diabetes mellitus with unspecified complications: Principal | ICD-10-CM

## 2017-03-01 DIAGNOSIS — IMO0002 Reserved for concepts with insufficient information to code with codable children: Secondary | ICD-10-CM

## 2017-03-01 DIAGNOSIS — E1165 Type 2 diabetes mellitus with hyperglycemia: Secondary | ICD-10-CM

## 2017-03-01 MED ORDER — METFORMIN HCL ER (OSM) 1000 MG PO TB24: 1000.0000 mg | ORAL_TABLET | Freq: Every day | ORAL | 0 refills | Status: DC

## 2017-03-06 ENCOUNTER — Other Ambulatory Visit: Payer: Self-pay

## 2017-03-06 ENCOUNTER — Ambulatory Visit (INDEPENDENT_AMBULATORY_CARE_PROVIDER_SITE_OTHER): Payer: 59 | Admitting: Family Medicine

## 2017-03-06 ENCOUNTER — Encounter: Payer: Self-pay | Admitting: Family Medicine

## 2017-03-06 VITALS — BP 122/62 | HR 98 | Temp 98.4°F | Ht 64.17 in | Wt 195.8 lb

## 2017-03-06 DIAGNOSIS — M869 Osteomyelitis, unspecified: Secondary | ICD-10-CM | POA: Diagnosis not present

## 2017-03-06 DIAGNOSIS — B351 Tinea unguium: Secondary | ICD-10-CM | POA: Diagnosis not present

## 2017-03-06 DIAGNOSIS — E1165 Type 2 diabetes mellitus with hyperglycemia: Secondary | ICD-10-CM | POA: Diagnosis not present

## 2017-03-06 DIAGNOSIS — E1169 Type 2 diabetes mellitus with other specified complication: Secondary | ICD-10-CM | POA: Diagnosis not present

## 2017-03-06 DIAGNOSIS — E781 Pure hyperglyceridemia: Secondary | ICD-10-CM | POA: Diagnosis not present

## 2017-03-06 DIAGNOSIS — E118 Type 2 diabetes mellitus with unspecified complications: Secondary | ICD-10-CM | POA: Diagnosis not present

## 2017-03-06 LAB — POCT GLYCOSYLATED HEMOGLOBIN (HGB A1C): Hemoglobin A1C: 6.7

## 2017-03-06 MED ORDER — METFORMIN HCL ER (OSM) 1000 MG PO TB24: 1000.0000 mg | ORAL_TABLET | Freq: Every day | ORAL | 1 refills | Status: DC

## 2017-03-06 MED ORDER — CICLOPIROX 8 % EX SOLN: Freq: Every day | CUTANEOUS | 2 refills | Status: DC

## 2017-03-06 MED ORDER — GLIPIZIDE 5 MG PO TABS: 5.0000 mg | ORAL_TABLET | Freq: Two times a day (BID) | ORAL | 1 refills | Status: DC

## 2017-03-06 NOTE — Patient Instructions (Addendum)
  1. a1c 6.7 - muy bien   IF you received an x-ray today, you will receive an invoice from [facility]. Please contact [facility] at [phone] with questions or concerns regarding your invoice.   IF you received labwork today, you will receive an invoice from [facility]. Please contact [facility] at [phone] with questions or concerns regarding your invoice.   Our billing staff will not be able to assist you with questions regarding bills from these companies.  You will be contacted with the lab results as soon as they are available. The fastest way to get your results is to activate your My Chart account. Instructions are located on the last page of this paperwork. If you have not heard from us regarding the results in 2 weeks, please contact this office.

## 2017-03-06 NOTE — Progress Notes (Signed)
1/3/20193:46 PM  Ethan [name] [birth year], 52 y.o. male [id number]  Chief Complaint  Patient presents with  . Medication Refill    METFORMIN AND GLUCOTROL. PT IS ASKING FOR 90 DAY SUPPLY DUE TO INSURANCE    HPI:   Patient is a 52 y.o. male with past medical history significant for DM2 and recent osteomyelitis who presents today for follow-up  1. Checking cbgs, range 90-165 2. Saw podiatry last week, callus are being filed, completed treatment for osteo, released but will continue with routine foot care 3. Saw optho on 12/7, reports doing well, sees them q3 months  Has no acute concerns today  Depression screen [employer] 2/9 03/06/2017 11/29/2016  Decreased Interest 0 0  Down, Depressed, Hopeless 0 0  PHQ - 2 Score 0 0    No Known Allergies  Prior to Admission medications   Medication Sig Start Date End Date Taking? Authorizing Provider  blood glucose meter kit and supplies KIT Dispense based on patient and insurance preference. Test once a day, fasting. DX E11.65, E11.8 11/29/16  Yes Rutherford Guys, MD  glipiZIDE (GLUCOTROL) 5 MG tablet Take 5 mg by mouth 2 (two) times daily with a meal. 11/28/16  Yes [provider]  glucose blood test strip For appropriate glucometer, Test once a day, fasting. DX E11.65, E11.8 11/29/16  Yes Rutherford Guys, MD  LANCETS MICRO THIN 33G MISC Dispense based on patient and insurance preference. Test once a day, fasting. DX E11.65, E11.8 11/29/16  Yes Rutherford Guys, MD  metformin (FORTAMET) 1000 MG (OSM) 24 hr tablet Take 1 tablet (1,000 mg total) by mouth daily with breakfast. 03/01/17  Yes Rutherford Guys, MD  acetaminophen (TYLENOL) 325 MG tablet Take 650 mg by mouth. 11/28/16   [provider]  cetirizine (ZYRTEC) 10 MG tablet 1 tablet twice a day for 7 days Patient not taking: Reported on 11/29/2016 06/18/14   Leandrew Koyanagi, MD  Fenofibrate 120 MG TABS Take 1 tablet (120 mg total) by mouth daily. Patient not taking: Reported  on 11/29/2016 06/27/14   Leandrew Koyanagi, MD  lactobacillus (FLORANEX/LACTINEX) PACK Take 1 packet by mouth 3 (three) times daily. 11/28/16   [provider]    Past Medical History:  Diagnosis Date  . Diabetes mellitus without complication ([place])   . Osteomyelitis of foot ([place]) 11/2016    No past surgical history on file.  Social History   Tobacco Use  . Smoking status: Never Smoker  . Smokeless tobacco: Never Used  Substance Use Topics  . Alcohol use: No    Family History  Problem Relation Age of Onset  . Cancer Father     Review of Systems  Constitutional: Negative for chills and fever.  Respiratory: Negative for cough and shortness of breath.   Cardiovascular: Negative for chest pain, palpitations and leg swelling.  Gastrointestinal: Negative for abdominal pain, nausea and vomiting.   Per hpi  OBJECTIVE:  Blood pressure 122/62, pulse 98, temperature 98.4 F (36.9 C), temperature source Oral, height 5' 4.17" (1.63 m), weight 195 lb 12.8 oz (88.8 kg), SpO2 94 %.  Physical Exam  Constitutional: He is oriented to person, place, and time and well-developed, well-nourished, and in no distress.  HENT:  Head: Normocephalic and atraumatic.  Mouth/Throat: Oropharynx is clear and moist.  Eyes: EOM are normal. Pupils are equal, round, and reactive to light.  Neck: Neck supple.  Cardiovascular: Normal rate and regular rhythm. Exam reveals no gallop and no friction rub.  No murmur heard. Pulmonary/Chest: Effort normal and breath sounds normal. He has no wheezes. He has no rales.  Neurological: He is alert and oriented to person, place, and time. Gait normal.  Skin: Skin is warm and dry.     Diabetic Foot Exam - Simple   Simple Foot Form Visual Inspection No deformities, no ulcerations, no other skin breakdown bilaterally:  Yes Sensation Testing Intact to touch and monofilament testing bilaterally:  Yes Pulse Check Posterior Tibialis and Dorsalis pulse intact  bilaterally:  Yes Comments Has onychomycosis, has callus bilaterally     Results for orders placed or performed in visit on 03/06/17 (from the past 24 hour(s))  POCT glycosylated hemoglobin (Hb A1C)     Status: None   Collection Time: 03/06/17  4:25 PM  Result Value Ref Range   Hemoglobin A1C 6.7     ASSESSMENT and PLAN  1. Type 2 diabetes mellitus with complication, without long-term current use of insulin (HCC) A1c at goal. Continue current regime.  - POCT glycosylated hemoglobin (Hb A1C) - CBC - Comprehensive metabolic panel - Microalbumin/Creatinine Ratio, Urine - metformin (FORTAMET) 1000 MG (OSM) 24 hr tablet; Take 1 tablet (1,000 mg total) by mouth daily with breakfast.  2. Hypertriglyceridemia - Lipid panel - TSH  3. Onychomycosis Discussed supportive measures, new meds r/se/b and RTC precautions.  4. Diabetic osteomyelitis ([place]) Resolved.  Other orders - glipiZIDE (GLUCOTROL) 5 MG tablet; Take 1 tablet (5 mg total) by mouth 2 (two) times daily with a meal. - ciclopirox (PENLAC) 8 % solution; Apply topically at bedtime. Apply over nail and surrounding skin. Apply daily over previous coat. After seven (7) days, may remove with alcohol and continue cycle.  Return in about 3 months (around 06/04/2017).    Rutherford Guys, MD Primary Care at [place], [place] [address] Ph.  [phone] Fax [phone]

## 2017-03-07 ENCOUNTER — Other Ambulatory Visit: Payer: Self-pay | Admitting: Family Medicine

## 2017-03-07 DIAGNOSIS — E781 Pure hyperglyceridemia: Secondary | ICD-10-CM

## 2017-03-07 LAB — MICROALBUMIN / CREATININE URINE RATIO
Creatinine, Urine: 120.1 mg/dL
Microalb/Creat Ratio: 8.7 mg/g creat (ref 0.0–30.0)
Microalbumin, Urine: 10.5 ug/mL

## 2017-03-07 LAB — CBC
Hematocrit: 45.5 % (ref 37.5–51.0)
Hemoglobin: 15.8 g/dL (ref 13.0–17.7)
MCH: 29.9 pg (ref 26.6–33.0)
MCHC: 34.7 g/dL (ref 31.5–35.7)
MCV: 86 fL (ref 79–97)
Platelets: 235 10*3/uL (ref 150–379)
RBC: 5.28 x10E6/uL (ref 4.14–5.80)
RDW: 14.5 % (ref 12.3–15.4)
WBC: 8.4 10*3/uL (ref 3.4–10.8)

## 2017-03-07 LAB — COMPREHENSIVE METABOLIC PANEL
ALT: 17 IU/L (ref 0–44)
AST: 11 IU/L (ref 0–40)
Albumin/Globulin Ratio: 1.5 (ref 1.2–2.2)
Albumin: 4.2 g/dL (ref 3.5–5.5)
Alkaline Phosphatase: 74 IU/L (ref 39–117)
BUN/Creatinine Ratio: 27 — ABNORMAL HIGH (ref 9–20)
BUN: 15 mg/dL (ref 6–24)
Bilirubin Total: <0.2 mg/dL (ref 0.0–1.2)
CO2: 19 mmol/L — ABNORMAL LOW (ref 20–29)
Calcium: 9.7 mg/dL (ref 8.7–10.2)
Chloride: 102 mmol/L (ref 96–106)
Creatinine, Ser: 0.56 mg/dL — ABNORMAL LOW (ref 0.76–1.27)
GFR calc Af Amer: 137 mL/min/{1.73_m2} (ref >59)
GFR calc non Af Amer: 118 mL/min/{1.73_m2} (ref >59)
Globulin, Total: 2.8 g/dL (ref 1.5–4.5)
Glucose: 196 mg/dL — ABNORMAL HIGH (ref 65–99)
Potassium: 4.4 mmol/L (ref 3.5–5.2)
Sodium: 140 mmol/L (ref 134–144)
Total Protein: 7 g/dL (ref 6.0–8.5)

## 2017-03-07 LAB — LIPID PANEL
Chol/HDL Ratio: 7.7 ratio — ABNORMAL HIGH (ref 0.0–5.0)
Cholesterol, Total: 208 mg/dL — ABNORMAL HIGH (ref 100–199)
HDL: 27 mg/dL — ABNORMAL LOW (ref >39)
Triglycerides: 940 mg/dL (ref 0–149)

## 2017-03-07 LAB — TSH: TSH: 2.98 u[IU]/mL (ref 0.450–4.500)

## 2017-03-07 MED ORDER — FENOFIBRATE 120 MG PO TABS: 120.0000 mg | ORAL_TABLET | Freq: Every day | ORAL | 1 refills | Status: DC

## 2017-03-11 ENCOUNTER — Telehealth: Payer: Self-pay

## 2017-03-11 MED ORDER — METFORMIN HCL ER 500 MG PO TB24: 1000.0000 mg | ORAL_TABLET | Freq: Every day | ORAL | 1 refills | Status: DC

## 2017-03-11 NOTE — Telephone Encounter (Signed)
Pt stated he picked up his fenofibrate but he also needs a refill on his Metformin as well

## 2017-03-11 NOTE — Telephone Encounter (Signed)
Changed metformin per insurance preference

## 2017-03-12 ENCOUNTER — Telehealth: Payer: Self-pay

## 2017-03-12 NOTE — Telephone Encounter (Signed)
DIRECTVCalled insurance company because I could not get Cover My Meds to accept Dr. Leretha PolSantiago as a provider.  Request was processed and we should receive a fax in 2-5 business days with response.

## 2017-03-13 NOTE — Telephone Encounter (Signed)
Received fax today requesting additional information.  I completed form and gave it to The PepsiJulie G. To have doctor sign and then faxed to insurance company.  Office notes accompanied fax.

## 2017-03-18 NOTE — Telephone Encounter (Signed)
Spoke to representative who told me that claim was approved.  Patient aware.

## 2017-04-02 ENCOUNTER — Telehealth: Payer: Self-pay

## 2017-04-02 NOTE — Telephone Encounter (Signed)
Started PA through cover my meds for metformin [facility] ER.  Key is [id number].  Should be ready within 3 business days.  Notification via fax.

## 2017-04-03 NOTE — Telephone Encounter (Signed)
Received a Denial through cover my meds.  This medication is a plan exclusion. [id number].  Provider aware.

## 2017-04-14 ENCOUNTER — Other Ambulatory Visit: Payer: Self-pay | Admitting: Family Medicine

## 2017-04-14 MED ORDER — METFORMIN HCL 1000 MG PO TABS: 1000.0000 mg | ORAL_TABLET | Freq: Two times a day (BID) | ORAL | 3 refills | Status: DC

## 2017-05-12 DIAGNOSIS — L97519 Non-pressure chronic ulcer of other part of right foot with unspecified severity: Secondary | ICD-10-CM | POA: Diagnosis not present

## 2017-05-12 DIAGNOSIS — E11621 Type 2 diabetes mellitus with foot ulcer: Secondary | ICD-10-CM | POA: Diagnosis not present

## 2017-06-06 ENCOUNTER — Ambulatory Visit: Payer: 59 | Admitting: Family Medicine

## 2017-06-06 ENCOUNTER — Encounter: Payer: Self-pay | Admitting: Family Medicine

## 2017-06-06 ENCOUNTER — Other Ambulatory Visit: Payer: Self-pay

## 2017-06-06 VITALS — BP 120/80 | HR 89 | Temp 97.5°F | Ht 64.5 in | Wt 202.8 lb

## 2017-06-06 DIAGNOSIS — E118 Type 2 diabetes mellitus with unspecified complications: Secondary | ICD-10-CM | POA: Diagnosis not present

## 2017-06-06 DIAGNOSIS — E781 Pure hyperglyceridemia: Secondary | ICD-10-CM

## 2017-06-06 LAB — POCT GLYCOSYLATED HEMOGLOBIN (HGB A1C): Hemoglobin A1C: 6.3

## 2017-06-06 MED ORDER — GLIPIZIDE 5 MG PO TABS: 5.0000 mg | ORAL_TABLET | Freq: Two times a day (BID) | ORAL | 1 refills | Status: DC

## 2017-06-06 MED ORDER — FENOFIBRATE 120 MG PO TABS: 120.0000 mg | ORAL_TABLET | Freq: Every day | ORAL | 1 refills | Status: DC

## 2017-06-06 MED ORDER — METFORMIN HCL 1000 MG PO TABS: 1000.0000 mg | ORAL_TABLET | Freq: Two times a day (BID) | ORAL | 3 refills | Status: DC

## 2017-06-06 NOTE — Patient Instructions (Addendum)
IF you received an x-ray today, you will receive an invoice from [facility]. Please contact [facility] at [phone] with questions or concerns regarding your invoice.   IF you received labwork today, you will receive an invoice from [facility]. Please contact [facility] at [phone] with questions or concerns regarding your invoice.   Our billing staff will not be able to assist you with questions regarding bills from these companies.  You will be contacted with the lab results as soon as they are available. The fastest way to get your results is to activate your My Chart account. Instructions are located on the last page of this paperwork. If you have not heard from [place] regarding the results in 2 weeks, please contact this office.    Opciones de alimentos para Stage manager de triglicridos (Food Choices to Lower Your Triglycerides) Los triglicridos son un tipo de grasas que se Hotel manager. Un nivel elevado de triglicridos puede aumentar el riesgo de padecer enfermedades cardacas e infartos. Si sus niveles de triglicridos son altos, los alimentos que se ingieren y los hbitos de alimentacin son Engineer, production. Elegir los alimentos adecuados puede ayudar a Stage manager de triglicridos. QU PAUTAS GENERALES DEBO SEGUIR?  Baje de peso si es necesario.  Limite o evite el alcohol.  Llene la mitad del plato con vegetales y ensaladas de hojas verdes.  Limite las frutas a dos porciones por da. Elija frutas en lugar de jugos.  Ocupe un cuarto del plato con cereales integrales. Busque la palabra "integral" en Estate agent de la lista de ingredientes.  Llene un cuarto del plato con alimentos con protenas magras.  Disfrute de pescados grasos (como salmn, caballa, sardinas y atn) tres veces por semana.  Elija las grasas saludables.  Limite los alimentos con alto contenido de almidn y International aid/development worker.  Consuma ms comida casera y menos de  restaurante, de buf y comida rpida.  Limite el consumo de alimentos fritos.  Cocine los alimentos utilizando mtodos que no sean la fritura.  Limite el consumo de grasas saturadas.  Verifique las listas de ingredientes para evitar alimentos con aceites parcialmente hidrogenados (grasas trans).  QU ALIMENTOS PUEDO COMER? Cereales Cereales integrales, como los panes de salvado o [place], las [place], los cereales y las pastas. Avena sin endulzar, trigo, [place], quinua o arroz integral. Tortillas de harina de maz o de salvado. Vegetales Verduras frescas o congeladas (crudas, al vapor, asadas o grilladas). Ensaladas de hojas verdes. Fruits Frutas frescas, en conserva (en su jugo natural) o frutas congeladas. Carnes y otros productos con protenas Carne de res molida (al 85% o ms [place]), carne de res de animales alimentados con pastos o carne de res sin la grasa. Pollo o pavo sin piel. Carne de pollo o de [place]. Cerdo sin la grasa. Todos los pescados y frutos de mar. Huevos. Porotos, guisantes o lentejas secos. Frutos secos o semillas sin sal. Frijoles secos o en lata sin sal. Lcteos Productos lcteos con bajo contenido de grasas, como [place] o al 1%, quesos reducidos en grasas o al 2%, ricota con bajo contenido de grasas o [employer], o yogur natural con bajo contenido de [place]. Grasas y Writer en barra que no contengan grasas trans. [place] y condimentos para ensaladas livianos o reducidos en grasas. [place]. Aceites de crtamo, oliva o canola. [place] natural de man o almendra. Los artculos mencionados arriba pueden no ser [employer]on de las bebidas o los alimentos  recomendados. Comunquese con el nutricionista para conocer ms opciones. QU ALIMENTOS NO SE RECOMIENDAN? Cereales Pan blanco. Pastas blancas. Arroz blanco. Pan de maz. Bagels, pasteles y croissants. Galletas saladas que contengan grasas trans. Vegetales Papas  blancas. Maz. Vegetales con crema o fritos. Verduras en salsa de [place]. Fruits Frutas secas. Fruta enlatada en almbar liviano o espeso. Jugo de frutas. Carnes y otros productos con protenas Cortes de carne con Holiday representativegrasa. Costillas, alas de pollo, tocineta, salchicha, mortadela, salame, chinchulines, tocino, perros calientes, salchichas alemanas y embutidos envasados. Lcteos [place] entera o al 2%, crema, mezcla de [place] y crema y queso crema. Yogur entero o endulzado. Quesos con toda su grasa. Cremas no lcteas y coberturas batidas. Quesos procesados, quesos para untar o cuajadas. Dulces y postres Jarabe de maz, azcares, miel y Radio broadcast assistantmelazas. Caramelos. Mermelada y Kazakhstanjalea. Doreen BeamJarabe. Cereales endulzados. Galletas, pasteles, bizcochuelos, donas, muffins y helado. Grasas y [address] [place], Indiamargarina en barra, Ladoniamanteca de Antonitocerdo, Atkinsgrasa, Singaporemantequilla clarificada o grasa de tocino. Aceites de coco, de palmiste o de palma. Bebidas Alcohol. Bebidas endulzadas (como refrescos, limonadas y bebidas frutales o ponches). Los artculos mencionados arriba pueden no ser Raytheonuna lista completa de las bebidas y los alimentos que se Theatre stage managerdeben evitar. Comunquese con el nutricionista para recibir ms informacin. Esta informacin no tiene Theme park managercomo fin reemplazar el consejo del mdico. Asegrese de hacerle al mdico cualquier pregunta que tenga. Document Released: 08/08/2009 Document Revised: 02/23/2013 Document Reviewed: 12/23/2012 Elsevier Interactive Patient Education  2017 [employer]

## 2017-06-06 NOTE — Progress Notes (Signed)
4/5/20194:09 PM  Ethan [name] February 03, 1966, 52 y.o. male [id number]  Chief Complaint  Patient presents with  . Follow-up    follow up diabetes 2 mellitus with complications  . Medication Refill    needing 3 month refill on metformin ans Glucotrol    HPI:   Patient is a 52 y.o. male with past medical history significant for DM2 with complications and elevated TG who presents today for followup on chronic medical conditions.  Saw podiatry 05/12/17 - followup for RIGHT foot diabetic ulcer, doing well Last a1c 6.7 TG 940, has not restarted fenofibrate Checks cbgs at home, fasting, 90-150s, denies lows Sees ophto each July Has no acute concerns today  Depression screen [facility] 2/9 06/06/2017 03/06/2017 11/29/2016  Decreased Interest 0 0 0  Down, Depressed, Hopeless 0 0 0  PHQ - 2 Score 0 0 0    No Known Allergies  Prior to Admission medications   Medication Sig Start Date End Date Taking? Authorizing Provider  glipiZIDE (GLUCOTROL) 5 MG tablet Take 1 tablet (5 mg total) by mouth 2 (two) times daily with a meal. 03/06/17  Yes Rutherford Guys, MD  metFORMIN (GLUCOPHAGE) 1000 MG tablet Take 1 tablet (1,000 mg total) by mouth 2 (two) times daily with a meal. 04/14/17  Yes Rutherford Guys, MD  acetaminophen (TYLENOL) 325 MG tablet Take 650 mg by mouth. 11/28/16   [provider]  blood glucose meter kit and supplies KIT Dispense based on patient and insurance preference. Test once a day, fasting. DX E11.65, E11.8 Patient not taking: Reported on 06/06/2017 11/29/16   Rutherford Guys, MD  cetirizine (ZYRTEC) 10 MG tablet 1 tablet twice a day for 7 days Patient not taking: Reported on 06/06/2017 06/18/14   Leandrew Koyanagi, MD  Fenofibrate 120 MG TABS Take 1 tablet (120 mg total) by mouth daily. Patient not taking: Reported on 06/06/2017 03/07/17   Rutherford Guys, MD  glucose blood test strip For appropriate glucometer, Test once a day, fasting. DX E11.65, E11.8 Patient not taking: Reported  on 06/06/2017 11/29/16   Rutherford Guys, MD  lactobacillus (FLORANEX/LACTINEX) PACK Take 1 packet by mouth 3 (three) times daily. 11/28/16   [provider]  LANCETS MICRO THIN 33G MISC Dispense based on patient and insurance preference. Test once a day, fasting. DX E11.65, E11.8 Patient not taking: Reported on 06/06/2017 11/29/16   Rutherford Guys, MD    Past Medical History:  Diagnosis Date  . Diabetes mellitus without complication ([place])   . Osteomyelitis of foot ([place]) 11/2016   right foot    History reviewed. No pertinent surgical history.  Social History   Tobacco Use  . Smoking status: Never Smoker  . Smokeless tobacco: Never Used  Substance Use Topics  . Alcohol use: No    Family History  Problem Relation Age of Onset  . Cancer Father     Review of Systems  Constitutional: Negative for chills and fever.  Eyes: Negative for blurred vision and double vision.  Respiratory: Negative for cough and shortness of breath.   Cardiovascular: Negative for chest pain, palpitations and leg swelling.  Gastrointestinal: Negative for abdominal pain, nausea and vomiting.  Genitourinary: Negative for frequency and urgency.  Neurological: Negative for tingling.  Endo/Heme/Allergies: Negative for polydipsia.     OBJECTIVE:  Blood pressure 120/80, pulse 89, temperature (!) 97.5 F (36.4 C), temperature source Oral, height 5' 4.5" (1.638 m), weight 202 lb 12.8 oz (92 kg), SpO2 97 %.  Physical Exam  Constitutional: He is oriented to person, place, and time and well-developed, well-nourished, and in no distress.  HENT:  Head: Normocephalic and atraumatic.  Mouth/Throat: Oropharynx is clear and moist.  Eyes: Pupils are equal, round, and reactive to light. EOM are normal.  Neck: Neck supple.  Cardiovascular: Normal rate and regular rhythm. Exam reveals no gallop and no friction rub.  No murmur heard. Pulmonary/Chest: Effort normal and breath sounds normal. He has no wheezes. He  has no rales.  Neurological: He is alert and oriented to person, place, and time. Gait normal.  Skin: Skin is warm and dry.     Wt Readings from Last 3 Encounters:  06/06/17 202 lb 12.8 oz (92 kg)  03/06/17 195 lb 12.8 oz (88.8 kg)  11/29/16 182 lb (82.6 kg)    Results for orders placed or performed in visit on 06/06/17 (from the past 24 hour(s))  POCT glycosylated hemoglobin (Hb A1C)     Status: None   Collection Time: 06/06/17  4:25 PM  Result Value Ref Range   Hemoglobin A1C 6.3     ASSESSMENT and PLAN  1. Type 2 diabetes mellitus with complication, without long-term current use of insulin (HCC) Controlled, patient however has had 7lbs weight gain, he feels that is diet related. Discussed importance of importance of low carb diet, regular exercise and healthy weight. Might also be related to glipizide. Consider changing to DDP4 at next visit is patient does not lose weight.  - Lipid panel - CMP14+EGFR - POCT glycosylated hemoglobin (Hb A1C)  2. Hypertriglyceridemia Discussed with patient TG ~ 900s and risks associated with these. Patient used to be on fenofibrate and tolerated it well, restarting today. Discussed LFM, patient educational handout given. - Fenofibrate 120 MG TABS; Take 1 tablet (120 mg total) by mouth daily.  Other orders - glipiZIDE (GLUCOTROL) 5 MG tablet; Take 1 tablet (5 mg total) by mouth 2 (two) times daily with a meal. - metFORMIN (GLUCOPHAGE) 1000 MG tablet; Take 1 tablet (1,000 mg total) by mouth 2 (two) times daily with a meal.  Return in about 3 months (around 09/05/2017).    Irma M Santiago, MD Primary Care at [place] [address] [place], [place] [address] Ph.  [phone] Fax [phone]   

## 2017-06-07 LAB — CMP14+EGFR
ALT: 21 IU/L (ref 0–44)
AST: 16 IU/L (ref 0–40)
Albumin/Globulin Ratio: 1.6 (ref 1.2–2.2)
Albumin: 4.2 g/dL (ref 3.5–5.5)
Alkaline Phosphatase: 66 IU/L (ref 39–117)
BUN/Creatinine Ratio: 22 — ABNORMAL HIGH (ref 9–20)
BUN: 17 mg/dL (ref 6–24)
Bilirubin Total: 0.2 mg/dL (ref 0.0–1.2)
CO2: 23 mmol/L (ref 20–29)
Calcium: 9.1 mg/dL (ref 8.7–10.2)
Chloride: 104 mmol/L (ref 96–106)
Creatinine, Ser: 0.79 mg/dL (ref 0.76–1.27)
GFR calc Af Amer: 119 mL/min/{1.73_m2} (ref >59)
GFR calc non Af Amer: 103 mL/min/{1.73_m2} (ref >59)
Globulin, Total: 2.6 g/dL (ref 1.5–4.5)
Glucose: 84 mg/dL (ref 65–99)
Potassium: 4.7 mmol/L (ref 3.5–5.2)
Sodium: 144 mmol/L (ref 134–144)
Total Protein: 6.8 g/dL (ref 6.0–8.5)

## 2017-06-07 LAB — LIPID PANEL
Chol/HDL Ratio: 8.9 ratio — ABNORMAL HIGH (ref 0.0–5.0)
Cholesterol, Total: 213 mg/dL — ABNORMAL HIGH (ref 100–199)
HDL: 24 mg/dL — ABNORMAL LOW (ref >39)
Triglycerides: 828 mg/dL (ref 0–149)

## 2017-06-07 NOTE — Progress Notes (Signed)
Slightly better than last, started of fenofibrate yesterday

## 2017-06-11 ENCOUNTER — Encounter: Payer: Self-pay | Admitting: Family Medicine

## 2017-08-27 ENCOUNTER — Telehealth: Payer: Self-pay | Admitting: Family Medicine

## 2017-08-27 NOTE — Telephone Encounter (Signed)
**  tried to call pt to inform him of his appt in [address] with Dr. Leretha PolSantiago on 09/05/17 - no answer and no VM**  If pt calls back, please inform him that his appt on 09/05/17 with Dr. Leretha PolSantiago has ben moved to [address].   Thanks!

## 2017-09-01 ENCOUNTER — Other Ambulatory Visit: Payer: Self-pay | Admitting: Family Medicine

## 2017-09-05 ENCOUNTER — Ambulatory Visit: Payer: 59 | Admitting: Family Medicine

## 2017-10-15 ENCOUNTER — Telehealth: Payer: Self-pay | Admitting: Family Medicine

## 2017-10-15 ENCOUNTER — Other Ambulatory Visit: Payer: Self-pay | Admitting: Family Medicine

## 2017-10-15 NOTE — Telephone Encounter (Signed)
Metformin 1000 mg refill Last Refill:06/06/17 # 180 with 3 refills Last OV: 06/06/17 PCP: I. [place]ago Pharmacy:[facility] [address]  Previously been discontinued because of insurance on 04/14/17. Reordered on 06/06/17   Prescription changed from 500 mg to 1000 mg tab

## 2017-10-15 NOTE — Telephone Encounter (Signed)
Called patient to clarify his pharmacy for his refills. His preferred pharmacy is [facility] [address] Pharmacy also notified.

## 2017-12-03 DIAGNOSIS — L97422 Non-pressure chronic ulcer of left heel and midfoot with fat layer exposed: Secondary | ICD-10-CM | POA: Diagnosis not present

## 2017-12-03 DIAGNOSIS — E11621 Type 2 diabetes mellitus with foot ulcer: Secondary | ICD-10-CM | POA: Diagnosis not present

## 2017-12-15 ENCOUNTER — Other Ambulatory Visit: Payer: Self-pay | Admitting: Family Medicine

## 2017-12-16 NOTE — Telephone Encounter (Signed)
Spoke with Kenney Houseman at [place] regarding pt scheduling request; pt able to be rescheduled for 12/26/17; pt offered and accepted appointment with Dr Leretha Pol, [address], 12/26/17 at 1600; he verbalizes understanding.

## 2017-12-16 NOTE — Telephone Encounter (Signed)
Requested Prescriptions  Pending Prescriptions Disp Refills  . glipiZIDE (GLUCOTROL) 5 MG tablet [Pharmacy Med Name: GLIPIZIDE 5MG  TABLETS] 60 tablet 0    Sig: TAKE 1 TABLET(5 MG) BY MOUTH TWICE DAILY WITH A MEAL     Endocrinology:  Diabetes - Sulfonylureas Failed - 12/15/2017  4:19 PM      Failed - HBA1C is between 0 and 7.9 and within 180 days    Hemoglobin A1C  Date Value Ref Range Status  06/06/2017 6.3  Final         Failed - Valid encounter within last 6 months    Recent Outpatient Visits          6 months ago Type 2 diabetes mellitus with complication, without long-term current use of insulin (HCC)   [facility], Meda Coffee, MD   9 months ago Type 2 diabetes mellitus with complication, without long-term current use of insulin [facility])   [facility], Meda Coffee, MD   1 year ago Uncontrolled type 2 diabetes mellitus with complication, without long-term current use of insulin [facility])   [facility], Meda Coffee, MD   3 years ago Swelling of upper lip   Primary Care at [facility], Harrel Lemon, MD   3 years ago Diabetes mellitus type 2 with complications   Primary Care at [facility], Thao P, DO

## 2017-12-16 NOTE — Telephone Encounter (Signed)
Contacted pt because last office visit 06/06/17; no upcoming visit noted; he would like to come on Friday 12/26/17 but Dr Leretha Pol has no avai  pt offered and accepted appointment with Dr Koren Shiver, [place] [address],01/12/18 at 1200; he verbalizes understanding.

## 2017-12-22 DIAGNOSIS — Z7984 Long term (current) use of oral hypoglycemic drugs: Secondary | ICD-10-CM | POA: Diagnosis not present

## 2017-12-22 DIAGNOSIS — E11621 Type 2 diabetes mellitus with foot ulcer: Secondary | ICD-10-CM | POA: Diagnosis not present

## 2017-12-22 DIAGNOSIS — L97509 Non-pressure chronic ulcer of other part of unspecified foot with unspecified severity: Secondary | ICD-10-CM | POA: Diagnosis not present

## 2017-12-26 ENCOUNTER — Ambulatory Visit: Payer: Self-pay | Admitting: Family Medicine

## 2017-12-26 ENCOUNTER — Other Ambulatory Visit: Payer: Self-pay | Admitting: *Deleted

## 2017-12-26 MED ORDER — GLIPIZIDE 5 MG PO TABS: 5.0000 mg | ORAL_TABLET | Freq: Two times a day (BID) | ORAL | 0 refills | Status: DC

## 2017-12-26 MED ORDER — METFORMIN HCL 1000 MG PO TABS: 1000.0000 mg | ORAL_TABLET | Freq: Two times a day (BID) | ORAL | 0 refills | Status: DC

## 2018-01-05 ENCOUNTER — Ambulatory Visit: Payer: 59 | Admitting: Family Medicine

## 2018-01-05 ENCOUNTER — Other Ambulatory Visit: Payer: Self-pay

## 2018-01-05 ENCOUNTER — Encounter: Payer: Self-pay | Admitting: Family Medicine

## 2018-01-05 VITALS — BP 119/70 | HR 91 | Temp 98.1°F | Resp 16 | Ht 64.0 in | Wt 202.4 lb

## 2018-01-05 DIAGNOSIS — Z119 Encounter for screening for infectious and parasitic diseases, unspecified: Secondary | ICD-10-CM

## 2018-01-05 DIAGNOSIS — Z6834 Body mass index (BMI) 34.0-34.9, adult: Secondary | ICD-10-CM | POA: Diagnosis not present

## 2018-01-05 DIAGNOSIS — E781 Pure hyperglyceridemia: Secondary | ICD-10-CM | POA: Diagnosis not present

## 2018-01-05 DIAGNOSIS — E118 Type 2 diabetes mellitus with unspecified complications: Secondary | ICD-10-CM

## 2018-01-05 MED ORDER — SAXAGLIPTIN HCL 5 MG PO TABS: 5.0000 mg | ORAL_TABLET | Freq: Every day | ORAL | 5 refills | Status: DC

## 2018-01-05 MED ORDER — ATORVASTATIN CALCIUM 40 MG PO TABS: 40.0000 mg | ORAL_TABLET | Freq: Every day | ORAL | 3 refills | Status: DC

## 2018-01-05 NOTE — Patient Instructions (Addendum)
  Parar el glipizide, empezar onglyza una al dia, continuar metformin empezar atorvastatin una al dia para el cholesterol   If you have lab work done today you will be contacted with your lab results within the next 2 weeks.  If you have not heard from [place] then please contact us. The fastest way to get your results is to register for My Chart.   IF you received an x-ray today, you will receive an invoice from [facility]. Please contact [facility] at [phone] with questions or concerns regarding your invoice.   IF you received labwork today, you will receive an invoice from [facility]. Please contact [facility] at [phone] with questions or concerns regarding your invoice.   Our billing staff will not be able to assist you with questions regarding bills from these companies.  You will be contacted with the lab results as soon as they are available. The fastest way to get your results is to activate your My Chart account. Instructions are located on the last page of this paperwork. If you have not heard from [place] regarding the results in 2 weeks, please contact this office.

## 2018-01-05 NOTE — Progress Notes (Signed)
11/4/20194:50 PM  Ethan [name] 11-01-1965, 52 y.o. male [id number]  Chief Complaint  Patient presents with  . Medication Refill    DIABETES-GLIPIZIDE, METFORMIN    HPI:   Patient is a 52 y.o. male with past medical history significant forDM2 with complications and elevated TG who presents today for followup on chronic medical conditions.  Overall doing well Seeing podiatry regularly for DM osteomyelitis, foot ulcer, resolved Was unable to see eye doctor in July, had to reschedule for next month Checks cbgs fasting < 150 Reports rare episodes of hypoglycemia Struggles with diet Does not exercise, but very active at work Change in metformin was favorable, tolerating well Declines flu vaccine  Lab Results  Component Value Date   HGBA1C 6.3 06/06/2017   HGBA1C 6.7 03/06/2017   HGBA1C 8.1 11/29/2016   Lab Results  Component Value Date   [place] Comment 06/06/2017   CREATININE 0.79 06/06/2017    Fall Risk  01/05/2018 06/06/2017 03/06/2017 11/29/2016  Falls in the past year? 0 No No No     Depression screen [facility] 2/9 01/05/2018 06/06/2017 03/06/2017  Decreased Interest 0 0 0  Down, Depressed, Hopeless 0 0 0  PHQ - 2 Score 0 0 0    No Known Allergies  Prior to Admission medications   Medication Sig Start Date End Date Taking? Authorizing Provider  blood glucose meter kit and supplies KIT Dispense based on patient and insurance preference. Test once a day, fasting. DX E11.65, E11.8 11/29/16  Yes Rutherford Guys, MD  glipiZIDE (GLUCOTROL) 5 MG tablet TAKE 1 TABLET(5 MG) BY MOUTH TWICE DAILY WITH A MEAL 12/16/17  Yes Rutherford Guys, MD  glucose blood test strip For appropriate glucometer, Test once a day, fasting. DX E11.65, E11.8 11/29/16  Yes Rutherford Guys, MD  LANCETS MICRO THIN 33G MISC Dispense based on patient and insurance preference. Test once a day, fasting. DX E11.65, E11.8 11/29/16  Yes Rutherford Guys, MD  metFORMIN (GLUCOPHAGE) 1000 MG tablet Take 1 tablet (1,000  mg total) by mouth 2 (two) times daily with a meal. 12/26/17  Yes Rutherford Guys, MD    Past Medical History:  Diagnosis Date  . Diabetes mellitus without complication ([place])   . Osteomyelitis of foot ([place]) 11/2016   right foot    No past surgical history on file.  Social History   Tobacco Use  . Smoking status: Never Smoker  . Smokeless tobacco: Never Used  Substance Use Topics  . Alcohol use: No    Family History  Problem Relation Age of Onset  . Cancer Father     Review of Systems  Constitutional: Negative for chills and fever.  Respiratory: Negative for cough and shortness of breath.   Cardiovascular: Negative for chest pain, palpitations and leg swelling.  Gastrointestinal: Negative for abdominal pain, nausea and vomiting.     OBJECTIVE:  Blood pressure 119/70, pulse 91, temperature 98.1 F (36.7 C), temperature source Oral, resp. rate 16, height 5' 4" (1.626 m), weight 202 lb 6.4 oz (91.8 kg), SpO2 97 %. Body mass index is 34.74 kg/m.   Wt Readings from Last 3 Encounters:  01/05/18 202 lb 6.4 oz (91.8 kg)  06/06/17 202 lb 12.8 oz (92 kg)  03/06/17 195 lb 12.8 oz (88.8 kg)    Physical Exam  Constitutional: He is oriented to person, place, and time. He appears well-developed and well-nourished.  HENT:  Head: Normocephalic and atraumatic.  Mouth/Throat: Oropharynx is clear and moist.  Eyes: Pupils  are equal, round, and reactive to light. Conjunctivae and EOM are normal.  Neck: Neck supple.  Cardiovascular: Normal rate and regular rhythm. Exam reveals no gallop and no friction rub.  No murmur heard. Pulmonary/Chest: Effort normal and breath sounds normal. He has no wheezes. He has no rales.  Musculoskeletal: He exhibits no edema.  Neurological: He is alert and oriented to person, place, and time.  Skin: Skin is warm and dry.  Psychiatric: He has a normal mood and affect.  Nursing note and vitals reviewed.   ASSESSMENT and PLAN  1. Type 2 diabetes  mellitus with complication, without long-term current use of insulin (HCC) Last a1c at goal however continued weight gain and some hypoglycemic events. Changing glipizde to onglyza. Cont checking cbgs at home. Cont LFM.  - Comprehensive metabolic panel - Hemoglobin A1c  2. Hypertriglyceridemia Starting atorvastatin  3. BMI 34.0-34.9,adult See #1  4. Screening examination for infectious disease - HIV antibody (with reflex) - HCV Ab w/Rflx to Verification  Other orders - saxagliptin HCl (ONGLYZA) 5 MG TABS tablet; Take 1 tablet (5 mg total) by mouth daily. - atorvastatin (LIPITOR) 40 MG tablet; Take 1 tablet (40 mg total) by mouth daily.  Return in about 3 months (around 04/07/2018) for chronic medical conditions.    Rutherford Guys, MD Primary Care at [place], [place] [address] Ph.  [phone] Fax [phone]

## 2018-01-06 LAB — COMPREHENSIVE METABOLIC PANEL
ALT: 18 IU/L (ref 0–44)
AST: 16 IU/L (ref 0–40)
Albumin/Globulin Ratio: 1.7 (ref 1.2–2.2)
Albumin: 4.6 g/dL (ref 3.5–5.5)
Alkaline Phosphatase: 108 IU/L (ref 39–117)
BUN/Creatinine Ratio: 15 (ref 9–20)
BUN: 17 mg/dL (ref 6–24)
Bilirubin Total: <0.2 mg/dL (ref 0.0–1.2)
CO2: 22 mmol/L (ref 20–29)
Calcium: 9.6 mg/dL (ref 8.7–10.2)
Chloride: 102 mmol/L (ref 96–106)
Creatinine, Ser: 1.11 mg/dL (ref 0.76–1.27)
GFR calc Af Amer: 87 mL/min/{1.73_m2} (ref >59)
GFR calc non Af Amer: 75 mL/min/{1.73_m2} (ref >59)
Globulin, Total: 2.7 g/dL (ref 1.5–4.5)
Glucose: 119 mg/dL — ABNORMAL HIGH (ref 65–99)
Potassium: 4.6 mmol/L (ref 3.5–5.2)
Sodium: 140 mmol/L (ref 134–144)
Total Protein: 7.3 g/dL (ref 6.0–8.5)

## 2018-01-06 LAB — HEMOGLOBIN A1C
Est. average glucose Bld gHb Est-mCnc: 140 mg/dL
Hgb A1c MFr Bld: 6.5 % — ABNORMAL HIGH (ref 4.8–5.6)

## 2018-01-06 LAB — HIV ANTIBODY (ROUTINE TESTING W REFLEX): HIV Screen 4th Generation wRfx: NONREACTIVE

## 2018-01-06 LAB — HCV INTERPRETATION

## 2018-01-06 LAB — HCV AB W/RFLX TO VERIFICATION: HCV Ab: 0.1 s/co ratio (ref 0.0–0.9)

## 2018-01-07 ENCOUNTER — Encounter: Payer: Self-pay | Admitting: *Deleted

## 2018-01-12 ENCOUNTER — Ambulatory Visit: Payer: Self-pay | Admitting: Family Medicine

## 2018-01-23 DIAGNOSIS — E08621 Diabetes mellitus due to underlying condition with foot ulcer: Secondary | ICD-10-CM | POA: Diagnosis not present

## 2018-01-23 DIAGNOSIS — L97512 Non-pressure chronic ulcer of other part of right foot with fat layer exposed: Secondary | ICD-10-CM | POA: Diagnosis not present

## 2018-01-23 DIAGNOSIS — M2142 Flat foot [pes planus] (acquired), left foot: Secondary | ICD-10-CM | POA: Diagnosis not present

## 2018-01-23 DIAGNOSIS — E11621 Type 2 diabetes mellitus with foot ulcer: Secondary | ICD-10-CM | POA: Diagnosis not present

## 2018-01-23 DIAGNOSIS — L97522 Non-pressure chronic ulcer of other part of left foot with fat layer exposed: Secondary | ICD-10-CM | POA: Diagnosis not present

## 2018-01-23 DIAGNOSIS — M19072 Primary osteoarthritis, left ankle and foot: Secondary | ICD-10-CM | POA: Diagnosis not present

## 2018-01-28 ENCOUNTER — Telehealth: Payer: Self-pay | Admitting: Family Medicine

## 2018-01-28 NOTE — Telephone Encounter (Signed)
Called and spoke with pt regarding their appt on 04/09/18. Due to provider schedule changes, we needed to get pt rescheduled. I was able to reschedule with Dr. Leretha PolSantiago on 04/07/18 at 4:40. I advised of time, building and late policy. Pt acknowledged.

## 2018-02-06 DIAGNOSIS — E11621 Type 2 diabetes mellitus with foot ulcer: Secondary | ICD-10-CM | POA: Diagnosis not present

## 2018-02-06 DIAGNOSIS — L97509 Non-pressure chronic ulcer of other part of unspecified foot with unspecified severity: Secondary | ICD-10-CM | POA: Diagnosis not present

## 2018-02-06 DIAGNOSIS — E11628 Type 2 diabetes mellitus with other skin complications: Secondary | ICD-10-CM | POA: Diagnosis not present

## 2018-02-06 DIAGNOSIS — L89892 Pressure ulcer of other site, stage 2: Secondary | ICD-10-CM | POA: Diagnosis not present

## 2018-02-06 DIAGNOSIS — L97522 Non-pressure chronic ulcer of other part of left foot with fat layer exposed: Secondary | ICD-10-CM | POA: Diagnosis not present

## 2018-02-10 DIAGNOSIS — L89892 Pressure ulcer of other site, stage 2: Secondary | ICD-10-CM | POA: Diagnosis not present

## 2018-02-10 DIAGNOSIS — E11628 Type 2 diabetes mellitus with other skin complications: Secondary | ICD-10-CM | POA: Diagnosis not present

## 2018-02-10 DIAGNOSIS — E08621 Diabetes mellitus due to underlying condition with foot ulcer: Secondary | ICD-10-CM | POA: Diagnosis not present

## 2018-02-10 DIAGNOSIS — M6702 Short Achilles tendon (acquired), left ankle: Secondary | ICD-10-CM | POA: Insufficient documentation

## 2018-02-10 DIAGNOSIS — L97422 Non-pressure chronic ulcer of left heel and midfoot with fat layer exposed: Secondary | ICD-10-CM | POA: Diagnosis not present

## 2018-02-10 DIAGNOSIS — E1142 Type 2 diabetes mellitus with diabetic polyneuropathy: Secondary | ICD-10-CM | POA: Diagnosis not present

## 2018-02-10 DIAGNOSIS — E11621 Type 2 diabetes mellitus with foot ulcer: Secondary | ICD-10-CM | POA: Diagnosis not present

## 2018-02-13 DIAGNOSIS — E11621 Type 2 diabetes mellitus with foot ulcer: Secondary | ICD-10-CM | POA: Diagnosis not present

## 2018-02-13 DIAGNOSIS — Z7984 Long term (current) use of oral hypoglycemic drugs: Secondary | ICD-10-CM | POA: Diagnosis not present

## 2018-02-13 DIAGNOSIS — L97422 Non-pressure chronic ulcer of left heel and midfoot with fat layer exposed: Secondary | ICD-10-CM | POA: Diagnosis not present

## 2018-02-16 DIAGNOSIS — L97422 Non-pressure chronic ulcer of left heel and midfoot with fat layer exposed: Secondary | ICD-10-CM | POA: Diagnosis not present

## 2018-02-16 DIAGNOSIS — E11621 Type 2 diabetes mellitus with foot ulcer: Secondary | ICD-10-CM | POA: Diagnosis not present

## 2018-02-23 DIAGNOSIS — E11621 Type 2 diabetes mellitus with foot ulcer: Secondary | ICD-10-CM | POA: Diagnosis not present

## 2018-02-23 DIAGNOSIS — E08621 Diabetes mellitus due to underlying condition with foot ulcer: Secondary | ICD-10-CM | POA: Diagnosis not present

## 2018-02-23 DIAGNOSIS — L97422 Non-pressure chronic ulcer of left heel and midfoot with fat layer exposed: Secondary | ICD-10-CM | POA: Diagnosis not present

## 2018-03-02 DIAGNOSIS — E11621 Type 2 diabetes mellitus with foot ulcer: Secondary | ICD-10-CM | POA: Diagnosis not present

## 2018-03-02 DIAGNOSIS — L97422 Non-pressure chronic ulcer of left heel and midfoot with fat layer exposed: Secondary | ICD-10-CM | POA: Diagnosis not present

## 2018-03-02 DIAGNOSIS — E1142 Type 2 diabetes mellitus with diabetic polyneuropathy: Secondary | ICD-10-CM | POA: Diagnosis not present

## 2018-03-09 DIAGNOSIS — L97529 Non-pressure chronic ulcer of other part of left foot with unspecified severity: Secondary | ICD-10-CM | POA: Diagnosis not present

## 2018-03-09 DIAGNOSIS — E11621 Type 2 diabetes mellitus with foot ulcer: Secondary | ICD-10-CM | POA: Diagnosis not present

## 2018-03-09 DIAGNOSIS — L97422 Non-pressure chronic ulcer of left heel and midfoot with fat layer exposed: Secondary | ICD-10-CM | POA: Diagnosis not present

## 2018-03-09 DIAGNOSIS — E08621 Diabetes mellitus due to underlying condition with foot ulcer: Secondary | ICD-10-CM | POA: Diagnosis not present

## 2018-03-16 DIAGNOSIS — L84 Corns and callosities: Secondary | ICD-10-CM | POA: Diagnosis not present

## 2018-03-16 DIAGNOSIS — L97422 Non-pressure chronic ulcer of left heel and midfoot with fat layer exposed: Secondary | ICD-10-CM | POA: Diagnosis not present

## 2018-03-16 DIAGNOSIS — E11621 Type 2 diabetes mellitus with foot ulcer: Secondary | ICD-10-CM | POA: Diagnosis not present

## 2018-03-16 DIAGNOSIS — E1142 Type 2 diabetes mellitus with diabetic polyneuropathy: Secondary | ICD-10-CM | POA: Diagnosis not present

## 2018-03-24 DIAGNOSIS — E11621 Type 2 diabetes mellitus with foot ulcer: Secondary | ICD-10-CM | POA: Diagnosis not present

## 2018-03-24 DIAGNOSIS — L97422 Non-pressure chronic ulcer of left heel and midfoot with fat layer exposed: Secondary | ICD-10-CM | POA: Diagnosis not present

## 2018-03-24 DIAGNOSIS — L97529 Non-pressure chronic ulcer of other part of left foot with unspecified severity: Secondary | ICD-10-CM | POA: Diagnosis not present

## 2018-03-24 DIAGNOSIS — L97522 Non-pressure chronic ulcer of other part of left foot with fat layer exposed: Secondary | ICD-10-CM | POA: Diagnosis not present

## 2018-03-24 DIAGNOSIS — E08621 Diabetes mellitus due to underlying condition with foot ulcer: Secondary | ICD-10-CM | POA: Diagnosis not present

## 2018-03-31 DIAGNOSIS — L97422 Non-pressure chronic ulcer of left heel and midfoot with fat layer exposed: Secondary | ICD-10-CM | POA: Diagnosis not present

## 2018-03-31 DIAGNOSIS — L84 Corns and callosities: Secondary | ICD-10-CM | POA: Diagnosis not present

## 2018-03-31 DIAGNOSIS — E11621 Type 2 diabetes mellitus with foot ulcer: Secondary | ICD-10-CM | POA: Diagnosis not present

## 2018-03-31 DIAGNOSIS — E08621 Diabetes mellitus due to underlying condition with foot ulcer: Secondary | ICD-10-CM | POA: Diagnosis not present

## 2018-04-07 ENCOUNTER — Ambulatory Visit: Payer: 59 | Admitting: Family Medicine

## 2018-04-07 DIAGNOSIS — L97421 Non-pressure chronic ulcer of left heel and midfoot limited to breakdown of skin: Secondary | ICD-10-CM

## 2018-04-07 DIAGNOSIS — E11621 Type 2 diabetes mellitus with foot ulcer: Secondary | ICD-10-CM | POA: Insufficient documentation

## 2018-04-09 ENCOUNTER — Ambulatory Visit: Payer: 59 | Admitting: Family Medicine

## 2018-04-14 DIAGNOSIS — L97421 Non-pressure chronic ulcer of left heel and midfoot limited to breakdown of skin: Secondary | ICD-10-CM | POA: Diagnosis not present

## 2018-04-14 DIAGNOSIS — E11621 Type 2 diabetes mellitus with foot ulcer: Secondary | ICD-10-CM | POA: Diagnosis not present

## 2018-04-28 DIAGNOSIS — L97421 Non-pressure chronic ulcer of left heel and midfoot limited to breakdown of skin: Secondary | ICD-10-CM | POA: Diagnosis not present

## 2018-04-28 DIAGNOSIS — E11621 Type 2 diabetes mellitus with foot ulcer: Secondary | ICD-10-CM | POA: Diagnosis not present

## 2018-05-07 ENCOUNTER — Encounter: Payer: Self-pay | Admitting: Family Medicine

## 2018-05-07 ENCOUNTER — Other Ambulatory Visit: Payer: Self-pay

## 2018-05-07 ENCOUNTER — Ambulatory Visit: Payer: 59 | Admitting: Family Medicine

## 2018-05-07 VITALS — BP 140/84 | HR 73 | Temp 98.3°F | Ht 64.0 in | Wt 204.0 lb

## 2018-05-07 DIAGNOSIS — L97421 Non-pressure chronic ulcer of left heel and midfoot limited to breakdown of skin: Secondary | ICD-10-CM

## 2018-05-07 DIAGNOSIS — E11621 Type 2 diabetes mellitus with foot ulcer: Secondary | ICD-10-CM

## 2018-05-07 DIAGNOSIS — E781 Pure hyperglyceridemia: Secondary | ICD-10-CM | POA: Diagnosis not present

## 2018-05-07 DIAGNOSIS — E118 Type 2 diabetes mellitus with unspecified complications: Secondary | ICD-10-CM | POA: Diagnosis not present

## 2018-05-07 DIAGNOSIS — Z125 Encounter for screening for malignant neoplasm of prostate: Secondary | ICD-10-CM | POA: Diagnosis not present

## 2018-05-07 MED ORDER — SAXAGLIPTIN HCL 5 MG PO TABS: 5.0000 mg | ORAL_TABLET | Freq: Every day | ORAL | 1 refills | Status: DC

## 2018-05-07 MED ORDER — ATORVASTATIN CALCIUM 40 MG PO TABS: 40.0000 mg | ORAL_TABLET | Freq: Every day | ORAL | 3 refills | Status: DC

## 2018-05-07 MED ORDER — METFORMIN HCL 1000 MG PO TABS: 1000.0000 mg | ORAL_TABLET | Freq: Two times a day (BID) | ORAL | 1 refills | Status: DC

## 2018-05-07 NOTE — Progress Notes (Signed)
3/5/20208:14 AM  Ethan [name] 08/12/1965, 53 y.o. male [id number]  Chief Complaint  Patient presents with  . Diabetes    HPI:   Patient is a 53 y.o. male with past medical history significant for DM2 with h/o diabetic foot ulcer, elevated TG who presents today for routine followup  Last OV nov 2019 Changed glipizide to onglyza as concerns for weight gain, however he did not make the change, he just added onglyza Started atorvastatin Continues to work with specialist for ulcer, seen wound care at [facility] on Apr 28 2018 - doing good, left plantar wound healed, left medial ulcer much improved, down to skin only, using orthothics, next appt in 2 weeks a1c done 02/10/18 - 6.1 Checks cbgs at home, 135-152 Has started to walk on most days after work for about 30 min Follows a healthy low carb diet Unable to lose weight Tolerating atorvastatin wo any issues Has not seen eye doctor yet. 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 0 0 No No No  Number falls in past yr: 0 - - - -  Injury with Fall? 0 - - - -     Depression screen [facility] 2/9 05/07/2018 01/05/2018 06/06/2017  Decreased Interest 0 0 0  Down, Depressed, Hopeless 0 0 0  PHQ - 2 Score 0 0 0    No Known Allergies  Prior to Admission medications   Medication Sig Start Date End Date Taking? Authorizing Provider  atorvastatin (LIPITOR) 40 MG tablet Take 1 tablet (40 mg total) by mouth daily. 01/05/18  Yes Rutherford Guys, MD  blood glucose meter kit and supplies KIT Dispense based on patient and insurance preference. Test once a day, fasting. DX E11.65, E11.8 11/29/16  Yes Rutherford Guys, MD  glipiZIDE (GLUCOTROL) 5 MG tablet TAKE 1 TABLET(5 MG) BY MOUTH TWICE DAILY WITH A MEAL 12/16/17  Yes Rutherford Guys, MD  glucose blood test strip For appropriate glucometer, Test once a day, fasting. DX E11.65,  E11.8 11/29/16  Yes Rutherford Guys, MD  LANCETS MICRO THIN 33G MISC Dispense based on patient and insurance preference. Test once a day, fasting. DX E11.65, E11.8 11/29/16  Yes Rutherford Guys, MD  metFORMIN (GLUCOPHAGE) 1000 MG tablet Take 1 tablet (1,000 mg total) by mouth 2 (two) times daily with a meal. 12/26/17  Yes Rutherford Guys, MD  saxagliptin HCl (ONGLYZA) 5 MG TABS tablet Take 1 tablet (5 mg total) by mouth daily. 01/05/18  Yes Rutherford Guys, MD    Past Medical History:  Diagnosis Date  . Diabetes mellitus without complication ([place])   . Osteomyelitis of foot ([place]) 11/2016   right foot    History reviewed. No pertinent surgical history.  Social History   Tobacco Use  . Smoking status: Never Smoker  . Smokeless tobacco: Never Used  Substance Use Topics  . Alcohol use: No    Family History  Problem Relation Age of Onset  . Cancer Father     Review of Systems  Constitutional: Negative for chills and fever.  Respiratory: Negative for cough and shortness of breath.   Cardiovascular: Negative for chest pain, palpitations and leg swelling.  Gastrointestinal: Negative for abdominal pain, nausea and vomiting.     OBJECTIVE:  Blood pressure 140/84, pulse 73, temperature 98.3 F (36.8 C), temperature source Oral, height '5\' 4"'  (1.626 m), weight  204 lb (92.5 kg), SpO2 97 %. Body mass index is 35.02 kg/m.   Wt Readings from Last 3 Encounters:  05/07/18 204 lb (92.5 kg)  01/05/18 202 lb 6.4 oz (91.8 kg)  06/06/17 202 lb 12.8 oz (92 kg)    Physical Exam Vitals signs and nursing note reviewed.  Constitutional:      Appearance: He is well-developed.  HENT:     Head: Normocephalic and atraumatic.  Eyes:     Conjunctiva/sclera: Conjunctivae normal.     Pupils: Pupils are equal, round, and reactive to light.  Neck:     Musculoskeletal: Neck supple.  Pulmonary:     Effort: Pulmonary effort is normal.  Skin:    General: Skin is warm and dry.  Neurological:      Mental Status: He is alert and oriented to person, place, and time.       ASSESSMENT and PLAN  1. Type 2 diabetes mellitus with complication, without long-term current use of insulin (HCC) At goal. However still having issues with weight loss, stop glipizide, increase exercise. Consider GLP1. Referring for eye exam - Lipid panel - Microalbumin / creatinine urine ratio - Basic Metabolic Panel - Ambulatory referral to Ophthalmology  2. Hypertriglyceridemia Started statin last visit, checking labs today  3. Prostate cancer screening - PSA  4. Diabetic ulcer of left midfoot associated with type 2 diabetes mellitus, limited to breakdown of skin (HCC) Ulcers are healing well, wearing DM shoes. Followed closely by wound care  Other orders - saxagliptin HCl (ONGLYZA) 5 MG TABS tablet; Take 1 tablet (5 mg total) by mouth daily. - metFORMIN (GLUCOPHAGE) 1000 MG tablet; Take 1 tablet (1,000 mg total) by mouth 2 (two) times daily with a meal. - atorvastatin (LIPITOR) 40 MG tablet; Take 1 tablet (40 mg total) by mouth daily.   Return in about 3 months (around 08/07/2018).    Rutherford Guys, MD Primary Care at [place], [place] [address] Ph.  [phone] Fax [phone]

## 2018-05-07 NOTE — Patient Instructions (Addendum)
Dejar de tomar glipizide, a ver si lo ayuda a bajar de [employer] con metformin, onglyza y atorvastatin aumentar el caminar a una hora cuatro veces a la semana piense en las vacunas    If you have lab work done today you will be contacted with your lab results within the next 2 weeks.  If you have not heard from [place] then please contact us. The fastest way to get your results is to register for My Chart.   IF you received an x-ray today, you will receive an invoice from [facility]. Please contact [facility] at [phone] with questions or concerns regarding your invoice.   IF you received labwork today, you will receive an invoice from [place]. Please contact [facility] at [phone] with questions or concerns regarding your invoice.   Our billing staff will not be able to assist you with questions regarding bills from these companies.  You will be contacted with the lab results as soon as they are available. The fastest way to get your results is to activate your My Chart account. Instructions are located on the last page of this paperwork. If you have not heard from [place] regarding the results in 2 weeks, please contact this office.

## 2018-05-08 ENCOUNTER — Telehealth: Payer: Self-pay | Admitting: Family Medicine

## 2018-05-08 LAB — BASIC METABOLIC PANEL
BUN/Creatinine Ratio: 19 (ref 9–20)
BUN: 13 mg/dL (ref 6–24)
CO2: 22 mmol/L (ref 20–29)
Calcium: 9.2 mg/dL (ref 8.7–10.2)
Chloride: 102 mmol/L (ref 96–106)
Creatinine, Ser: 0.69 mg/dL — ABNORMAL LOW (ref 0.76–1.27)
GFR calc Af Amer: 124 mL/min/{1.73_m2} (ref >59)
GFR calc non Af Amer: 108 mL/min/{1.73_m2} (ref >59)
Glucose: 251 mg/dL — ABNORMAL HIGH (ref 65–99)
Potassium: 4.6 mmol/L (ref 3.5–5.2)
Sodium: 139 mmol/L (ref 134–144)

## 2018-05-08 LAB — MICROALBUMIN / CREATININE URINE RATIO
Creatinine, Urine: 115.1 mg/dL
Microalb/Creat Ratio: 14 mg/g creat (ref 0–29)
Microalbumin, Urine: 15.9 ug/mL

## 2018-05-08 LAB — LIPID PANEL
Chol/HDL Ratio: 5.3 ratio — ABNORMAL HIGH (ref 0.0–5.0)
Cholesterol, Total: 133 mg/dL (ref 100–199)
HDL: 25 mg/dL — ABNORMAL LOW (ref >39)
Triglycerides: 411 mg/dL — ABNORMAL HIGH (ref 0–149)

## 2018-05-08 LAB — PSA: Prostate Specific Ag, Serum: 0.2 ng/mL (ref 0.0–4.0)

## 2018-05-08 NOTE — Telephone Encounter (Signed)
Copied from CRM [id number]. Topic: General - Other >> May 08, 2018 10:07 AM Darron Doom wrote: Reason for CRM: Marylene Land with [facility] called to request some notes on patient referral was received but no note of any kind. Requesting notes and demographics. Ph# [phone]

## 2018-05-13 NOTE — Telephone Encounter (Signed)
Documentation was faxed today to  [phone].

## 2018-05-15 DIAGNOSIS — Z8631 Personal history of diabetic foot ulcer: Secondary | ICD-10-CM | POA: Diagnosis not present

## 2018-05-22 MED ORDER — ATORVASTATIN CALCIUM 80 MG PO TABS: 80.0000 mg | ORAL_TABLET | Freq: Every day | ORAL | 3 refills | Status: DC

## 2018-05-22 NOTE — Addendum Note (Signed)
Addended by: Myles Lipps on: 05/22/2018 12:20 PM   Modules accepted: Orders

## 2018-05-29 DIAGNOSIS — E113393 Type 2 diabetes mellitus with moderate nonproliferative diabetic retinopathy without macular edema, bilateral: Secondary | ICD-10-CM | POA: Diagnosis not present

## 2018-05-29 DIAGNOSIS — H35033 Hypertensive retinopathy, bilateral: Secondary | ICD-10-CM | POA: Diagnosis not present

## 2018-05-29 DIAGNOSIS — H524 Presbyopia: Secondary | ICD-10-CM | POA: Diagnosis not present

## 2018-08-07 ENCOUNTER — Encounter: Payer: Self-pay | Admitting: Family Medicine

## 2018-08-07 ENCOUNTER — Other Ambulatory Visit: Payer: Self-pay

## 2018-08-07 ENCOUNTER — Ambulatory Visit: Payer: 59 | Admitting: Family Medicine

## 2018-08-07 VITALS — BP 124/76 | HR 85 | Temp 98.3°F | Ht 64.0 in | Wt 196.0 lb

## 2018-08-07 DIAGNOSIS — Z8631 Personal history of diabetic foot ulcer: Secondary | ICD-10-CM | POA: Diagnosis not present

## 2018-08-07 DIAGNOSIS — Z1211 Encounter for screening for malignant neoplasm of colon: Secondary | ICD-10-CM | POA: Diagnosis not present

## 2018-08-07 DIAGNOSIS — E1169 Type 2 diabetes mellitus with other specified complication: Secondary | ICD-10-CM

## 2018-08-07 DIAGNOSIS — E781 Pure hyperglyceridemia: Secondary | ICD-10-CM | POA: Diagnosis not present

## 2018-08-07 LAB — LIPID PANEL
Chol/HDL Ratio: 6.1 ratio — ABNORMAL HIGH (ref 0.0–5.0)
Cholesterol, Total: 177 mg/dL (ref 100–199)
HDL: 29 mg/dL — ABNORMAL LOW (ref >39)
Triglycerides: 401 mg/dL — ABNORMAL HIGH (ref 0–149)

## 2018-08-07 LAB — POCT GLYCOSYLATED HEMOGLOBIN (HGB A1C): Hemoglobin A1C: 6.4 % — AB (ref 4.0–5.6)

## 2018-08-07 MED ORDER — ATORVASTATIN CALCIUM 80 MG PO TABS: 80.0000 mg | ORAL_TABLET | Freq: Every day | ORAL | 3 refills | Status: DC

## 2018-08-07 NOTE — Progress Notes (Signed)
6/5/20209:29 AM  Ethan [name] 1965/06/06, 53 y.o., male [id number]  Chief Complaint  Patient presents with  . Diabetes    went to have eye testing done for glasses, has not been able to buy the glasses due to stores being closed. Went to foot doctor this past Wed, received insoles for feet and other tx  . Hypertension    No longer taking lipitor, received colonoscopy in [place]  . Medication Refill    metformin and saxagliptin    HPI:   Patient is a 53 y.o. male with past medical history significant for DM2 with h/o diabetic foot ulcer, elevated TG who presents today for routine followup  Last OV march 2020 Stopped glipizide due to weight concerns increased atorvastatin Continues to see podiatry for his foot ulcer  Patient continues to work on diet and exercsie Has been able to lose weight Tolerating metformin and onglyza Checks fasting cbgs 130-140s Denies any lows Saw eye doctor march 27th,  - Dr Herbert Deaner, denies any retinopathy, report not in chart yet  Patient reports he never received higher dose of atorvastatin from pharmacy  Patient reports that he had a colonoscopy in [place] About 5 years ago, patient reports normal results Denies fhx colon cancer   Lab Results  Component Value Date   HGBA1C 6.5 (H) 01/05/2018   HGBA1C 6.3 06/06/2017   HGBA1C 6.7 03/06/2017   Lab Results  Component Value Date   [place] Comment 05/07/2018   CREATININE 0.69 (L) 05/07/2018   Fall Risk  08/07/2018 05/07/2018 01/05/2018 06/06/2017 03/06/2017  Falls in the past year? 0 0 0 No No  Number falls in past yr: 0 0 - - -  Injury with Fall? 0 0 - - -     Depression screen [facility] 2/9 08/07/2018 05/07/2018 01/05/2018  Decreased Interest 0 0 0  Down, Depressed, Hopeless 0 0 0  PHQ - 2 Score 0 0 0    No Known Allergies  Prior to Admission medications   Medication Sig Start Date End Date Taking? Authorizing Provider  blood glucose meter kit and supplies KIT Dispense based on patient and  insurance preference. Test once a day, fasting. DX E11.65, E11.8 11/29/16  Yes Pamella Pert, Gage Treiber M, MD  glucose blood test strip For appropriate glucometer, Test once a day, fasting. DX E11.65, E11.8 11/29/16  Yes Rutherford Guys, MD  LANCETS MICRO THIN 33G MISC Dispense based on patient and insurance preference. Test once a day, fasting. DX E11.65, E11.8 11/29/16  Yes Rutherford Guys, MD  metFORMIN (GLUCOPHAGE) 1000 MG tablet Take 1 tablet (1,000 mg total) by mouth 2 (two) times daily with a meal. 05/07/18  Yes Rutherford Guys, MD  saxagliptin HCl (ONGLYZA) 5 MG TABS tablet Take 1 tablet (5 mg total) by mouth daily. 05/07/18  Yes Rutherford Guys, MD  ketoconazole (NIZORAL) 2 % cream  07/24/18   [provider]    Past Medical History:  Diagnosis Date  . Diabetes mellitus without complication ([place])   . Osteomyelitis of foot ([place]) 11/2016   right foot    History reviewed. No pertinent surgical history.  Social History   Tobacco Use  . Smoking status: Never Smoker  . Smokeless tobacco: Never Used  Substance Use Topics  . Alcohol use: No    Family History  Problem Relation Age of Onset  . Cancer Father     Review of Systems  Constitutional: Negative for chills and fever.  Respiratory: Negative for cough and shortness of  breath.   Cardiovascular: Negative for chest pain, palpitations and leg swelling.  Gastrointestinal: Negative for abdominal pain, nausea and vomiting.     OBJECTIVE:  Today's Vitals   08/07/18 0809  BP: 124/76  Pulse: 85  Temp: 98.3 F (36.8 C)  TempSrc: Oral  SpO2: 98%  Weight: 196 lb (88.9 kg)  Height: '5\' 4"'  (1.626 m)   Body mass index is 33.64 kg/m.  Wt Readings from Last 3 Encounters:  08/07/18 196 lb (88.9 kg)  05/07/18 204 lb (92.5 kg)  01/05/18 202 lb 6.4 oz (91.8 kg)    Physical Exam Vitals signs and nursing note reviewed.  Constitutional:      Appearance: He is well-developed.  HENT:     Head: Normocephalic and atraumatic.   Eyes:     Conjunctiva/sclera: Conjunctivae normal.     Pupils: Pupils are equal, round, and reactive to light.  Neck:     Musculoskeletal: Neck supple.  Cardiovascular:     Rate and Rhythm: Normal rate and regular rhythm.     Pulses: Normal pulses.     Heart sounds: No murmur. No friction rub. No gallop.   Pulmonary:     Effort: Pulmonary effort is normal.     Breath sounds: Normal breath sounds. No wheezing or rales.  Skin:    General: Skin is warm and dry.  Neurological:     Mental Status: He is alert and oriented to person, place, and time.     Diabetic Foot Exam - Simple   Simple Foot Form Visual Inspection No deformities, no ulcerations, no other skin breakdown bilaterally:  Yes Sensation Testing Intact to touch and monofilament testing bilaterally:  Yes Pulse Check Comments Felt all pricks with the filament, just went to foot doctor on this past Wed. Received insoles for the shoes and cut toe nails. Using foot cream bid     Results for orders placed or performed in visit on 08/07/18 (from the past 24 hour(s))  POCT glycosylated hemoglobin (Hb A1C)     Status: Abnormal   Collection Time: 08/07/18  9:57 AM  Result Value Ref Range   Hemoglobin A1C 6.4 (A) 4.0 - 5.6 %   HbA1c POC (<> result, manual entry)     HbA1c, POC (prediabetic range)     HbA1c, POC (controlled diabetic range)        ASSESSMENT and PLAN  1. Controlled type 2 diabetes mellitus with other specified complication, without long-term current use of insulin (HCC) Controlled. Continue current regime. Continue LFM and weight loss.  - POCT glycosylated hemoglobin (Hb A1C) - Lipid panel  2. Hypertriglyceridemia Resent rx for atorvastatin. Cont LFM. - Lipid panel  3. Colon cancer screening - Cologuard  4. H/O diabetic foot ulcer Followed closely by podiatry  Other orders - atorvastatin (LIPITOR) 80 MG tablet; Take 1 tablet (80 mg total) by mouth daily.  Return in about 6 months (around  02/06/2019).    Rutherford Guys, MD Primary Care at [place],  [address] Ph.  [phone] Fax [phone]

## 2018-08-07 NOTE — Patient Instructions (Signed)
° ° ° °  If you have lab work done today you will be contacted with your lab results within the next 2 weeks.  If you have not heard from us then please contact us. The fastest way to get your results is to register for My Chart. ° ° °IF you received an x-ray today, you will receive an invoice from [facility]. Please contact [facility] at [phone] with questions or concerns regarding your invoice.  ° °IF you received labwork today, you will receive an invoice from [facility]. Please contact [facility] at [phone] with questions or concerns regarding your invoice.  ° °Our billing staff will not be able to assist you with questions regarding bills from these companies. ° °You will be contacted with the lab results as soon as they are available. The fastest way to get your results is to activate your My Chart account. Instructions are located on the last page of this paperwork. If you have not heard from us regarding the results in 2 weeks, please contact this office. °  ° ° ° °

## 2018-08-11 MED ORDER — OMEGA-3-ACID ETHYL ESTERS 1 G PO CAPS: 2.0000 g | ORAL_CAPSULE | Freq: Two times a day (BID) | ORAL | 3 refills | Status: DC

## 2018-08-11 NOTE — Addendum Note (Signed)
Addended by: Rutherford Guys on: 08/11/2018 09:05 PM   Modules accepted: Orders

## 2018-08-12 ENCOUNTER — Other Ambulatory Visit: Payer: Self-pay | Admitting: Family Medicine

## 2018-08-13 ENCOUNTER — Other Ambulatory Visit: Payer: Self-pay | Admitting: Family Medicine

## 2018-09-23 LAB — COLOGUARD: Cologuard: NEGATIVE

## 2019-02-05 ENCOUNTER — Other Ambulatory Visit: Payer: Self-pay

## 2019-02-05 ENCOUNTER — Telehealth (INDEPENDENT_AMBULATORY_CARE_PROVIDER_SITE_OTHER): Payer: 59 | Admitting: Family Medicine

## 2019-02-05 DIAGNOSIS — E1169 Type 2 diabetes mellitus with other specified complication: Secondary | ICD-10-CM | POA: Insufficient documentation

## 2019-02-05 DIAGNOSIS — Z8631 Personal history of diabetic foot ulcer: Secondary | ICD-10-CM

## 2019-02-05 DIAGNOSIS — E782 Mixed hyperlipidemia: Secondary | ICD-10-CM | POA: Diagnosis not present

## 2019-02-05 MED ORDER — ATORVASTATIN CALCIUM 80 MG PO TABS: 80.0000 mg | ORAL_TABLET | Freq: Every day | ORAL | 3 refills | Status: DC

## 2019-02-05 MED ORDER — METFORMIN HCL 1000 MG PO TABS: ORAL_TABLET | ORAL | 1 refills | Status: DC

## 2019-02-05 MED ORDER — SAXAGLIPTIN HCL 5 MG PO TABS: 5.0000 mg | ORAL_TABLET | Freq: Every day | ORAL | 1 refills | Status: DC

## 2019-02-05 NOTE — Progress Notes (Signed)
Pt is needing refills on medication. Medication has been verified. No medical concerned today. Pt had to be transferred to telemed due to pending covid test.

## 2019-02-05 NOTE — Progress Notes (Signed)
Virtual Visit Note  I connected with patient on 02/05/19 at 855am by phone and verified that I am speaking with the correct person using two identifiers. Ethan [name] is currently located at home and patient is currently with them during visit. The provider, Rutherford Guys, MD is located in their office at time of visit.  I discussed the limitations, risks, security and privacy concerns of performing an evaluation and management service by telephone and the availability of in person appointments. I also discussed with the patient that there may be a patient responsible charge related to this service. The patient expressed understanding and agreed to proceed.   CC: 6 months followup  HPI ? PMH: DM2 with h/o diabetic foot ulcer, elevated TGwho presents today for routine followup  Last OV June 2020 - started lovaza Has seen podiatry, last OV oct 2020 - ulcer left foot, limited to skin breakdown, wound care started, f/u later this month  Pending covid test, asymptomatic Reports foot ulcer has healed Never started lovaza cbg this morning 142, average Denies any lows Denies any weight changes polyuria, polydipsia Taking metformin 1037m bid, onglyza 515mdaily Atorvastatin 8069m day Has no acute concerns today  Lab Results  Component Value Date   HGBA1C 6.4 (A) 08/07/2018   Lab Results  Component Value Date   CHOL 177 08/07/2018   HDL 29 (L) 08/07/2018   LDLCALC Comment 08/07/2018   TRIG 401 (H) 08/07/2018   CHOLHDL 6.1 (H) 08/07/2018    No Known Allergies  Prior to Admission medications   Medication Sig Start Date End Date Taking? Authorizing Provider  atorvastatin (LIPITOR) 80 MG tablet Take 1 tablet (80 mg total) by mouth daily. 08/07/18  Yes SanRutherford GuysD  blood glucose meter kit and supplies KIT Dispense based on patient and insurance preference. Test once a day, fasting. DX E11.65, E11.8 11/29/16  Yes SanPamella Pertrma M, MD  glucose blood test strip For  appropriate glucometer, Test once a day, fasting. DX E11.65, E11.8 11/29/16  Yes SanRutherford GuysD  ketoconazole (NIZORAL) 2 % cream  07/24/18  Yes [provider]  LANCETS MICRO THIN 33G MISC Dispense based on patient and insurance preference. Test once a day, fasting. DX E11.65, E11.8 11/29/16  Yes SanRutherford GuysD  metFORMIN (GLUCOPHAGE) 1000 MG tablet TAKE 1 TABLET(1000 MG) BY MOUTH TWICE DAILY WITH A MEAL 08/12/18  Yes SanRutherford GuysD  saxagliptin HCl (ONGLYZA) 5 MG TABS tablet Take 1 tablet (5 mg total) by mouth daily. 05/07/18  Yes SanRutherford GuysD    Past Medical History:  Diagnosis Date  . Diabetes mellitus without complication ([facility] . Osteomyelitis of foot ([facility]9/2018   right foot    No past surgical history on file.  Social History   Tobacco Use  . Smoking status: Never Smoker  . Smokeless tobacco: Never Used  Substance Use Topics  . Alcohol use: No    Family History  Problem Relation Age of Onset  . Cancer Father     Review of Systems  Constitutional: Negative for chills and fever.  Respiratory: Negative for cough and shortness of breath.   Cardiovascular: Negative for chest pain, palpitations and leg swelling.  Gastrointestinal: Negative for abdominal pain, nausea and vomiting.  per hpi  Objective  Vitals as reported by the patient: none  Gen: AAOx3, NAD Resp: speaking in full sentences, breathing comfortably   ASSESSMENT and PLAN  1. Controlled type 2 diabetes  mellitus with other specified complication, without long-term current use of insulin ([place]) todays fasting above goal, reviewed LFM with patient. Labs pending. meds to be adjusted as needed.  - Comprehensive metabolic panel; Future - Hemoglobin A1c; Future  2. H/O diabetic foot ulcer Followed closely by podiatry, discussed importance of glucose control. Has DM shoes  3. Mixed hyperlipidemia due to type 2 diabetes mellitus ([place]) Checking fasting labs once covid neg  results received, medications will be adjusted as needed. Cont working on [employer] - Lipid panel; Future  Other orders - atorvastatin (LIPITOR) 80 MG tablet; Take 1 tablet (80 mg total) by mouth daily. - metFORMIN (GLUCOPHAGE) 1000 MG tablet; TAKE 1 TABLET(1000 MG) BY MOUTH TWICE DAILY WITH A MEAL - saxagliptin HCl (ONGLYZA) 5 MG TABS tablet; Take 1 tablet (5 mg total) by mouth daily.  FOLLOW-UP: 6 months   The above assessment and management plan was discussed with the patient. The patient verbalized understanding of and has agreed to the management plan. Patient is aware to call the clinic if symptoms persist or worsen. Patient is aware when to return to the clinic for a follow-up visit. Patient educated on when it is appropriate to go to the emergency department.    I provided 12 minutes of non-face-to-face time during this encounter.  Rutherford Guys, MD [facility] at [place], [place] [address] Ph.  [phone] Fax [phone]

## 2019-02-13 ENCOUNTER — Ambulatory Visit (INDEPENDENT_AMBULATORY_CARE_PROVIDER_SITE_OTHER): Payer: 59

## 2019-02-13 ENCOUNTER — Ambulatory Visit (HOSPITAL_COMMUNITY)
Admission: EM | Admit: 2019-02-13 | Discharge: 2019-02-13 | Disposition: A | Discharge status: Home / Self Care | Payer: 59 | Attending: Emergency Medicine | Admitting: Emergency Medicine

## 2019-02-13 ENCOUNTER — Encounter (HOSPITAL_COMMUNITY): Payer: Self-pay | Admitting: Emergency Medicine

## 2019-02-13 ENCOUNTER — Other Ambulatory Visit: Payer: Self-pay

## 2019-02-13 DIAGNOSIS — J1282 Pneumonia due to coronavirus disease 2019: Secondary | ICD-10-CM

## 2019-02-13 DIAGNOSIS — U071 COVID-19: Secondary | ICD-10-CM

## 2019-02-13 DIAGNOSIS — J1289 Other viral pneumonia: Secondary | ICD-10-CM | POA: Diagnosis not present

## 2019-02-13 DIAGNOSIS — R0789 Other chest pain: Secondary | ICD-10-CM | POA: Diagnosis not present

## 2019-02-13 DIAGNOSIS — R05 Cough: Secondary | ICD-10-CM | POA: Diagnosis not present

## 2019-02-13 MED ORDER — DEXAMETHASONE SODIUM PHOSPHATE 10 MG/ML IJ SOLN
10.0000 mg | Freq: Once | INTRAMUSCULAR | Status: AC
  Administered 2019-02-13: 10 mg via INTRAMUSCULAR

## 2019-02-13 MED ORDER — ACETAMINOPHEN 325 MG PO TABS
ORAL_TABLET | ORAL | Status: AC
  Filled 2019-02-13: qty 2

## 2019-02-13 MED ORDER — DEXAMETHASONE SODIUM PHOSPHATE 10 MG/ML IJ SOLN
INTRAMUSCULAR | Status: AC
  Filled 2019-02-13: qty 1

## 2019-02-13 MED ORDER — ACETAMINOPHEN 325 MG PO TABS
650.0000 mg | ORAL_TABLET | Freq: Once | ORAL | Status: AC
  Administered 2019-02-13: 650 mg via ORAL

## 2019-02-13 MED ORDER — BENZONATATE 200 MG PO CAPS: 200.0000 mg | ORAL_CAPSULE | Freq: Three times a day (TID) | ORAL | 0 refills | Status: AC | PRN

## 2019-02-13 NOTE — ED Triage Notes (Signed)
Pt states he tested positive for covid dec 1st. He states hes had a cough that is lingering.

## 2019-02-13 NOTE — Discharge Instructions (Signed)
You pneumonia from the COVID virus. Antibiotics will not help this. We are giving you decadron today Continue to use tessalon/benzonatate for cough Follow up in emergency room if symptoms worsening or developing increased shortness of breath, fatigue or dizziness

## 2019-02-14 NOTE — ED Provider Notes (Signed)
[facility]    CSN: [id number] Arrival date & time: 02/13/19  1357      History   Chief Complaint Chief Complaint  Patient presents with  . Cough    HPI Ethan [name] is a 53 y.o. male history of DM type II, hyperlipidemia, presenting today for evaluation of cough.  Patient tested positive for Covid approximately 2 weeks ago.  He is presenting today as he had a persistent cough as well as discomfort in his chest.  Notes he has chest pain only with coughing, denies pain at rest.  Of note he began spiking fevers recently which he did not have early in course of Covid.  He denies significant congestion worse or sore throat.  He has not been taking medicines for symptoms.  Notes other household members have had Covid as well.  HPI  Past Medical History:  Diagnosis Date  . Diabetes mellitus without complication ([place])   . Osteomyelitis of foot ([place]) 11/2016   right foot    Patient Active Problem List   Diagnosis Date Noted  . Mixed hyperlipidemia due to type 2 diabetes mellitus ([place]) 02/05/2019  . H/O diabetic foot ulcer 08/07/2018  . Diabetic ulcer of left midfoot associated with type 2 diabetes mellitus, limited to breakdown of skin ([place]) 04/07/2018  . Pre-ulcerative corn or callous 03/16/2018  . Contracture of left Achilles tendon 02/10/2018  . Diabetic peripheral neuropathy ([place]) 02/10/2018  . Diabetes mellitus type II, controlled ([place]) 06/18/2014    History reviewed. No pertinent surgical history.     Home Medications    Prior to Admission medications   Medication Sig Start Date End Date Taking? Authorizing Provider  atorvastatin (LIPITOR) 80 MG tablet Take 1 tablet (80 mg total) by mouth daily. 02/05/19   Rutherford Guys, MD  benzonatate (TESSALON) 200 MG capsule Take 1 capsule (200 mg total) by mouth 3 (three) times daily as needed for up to 7 days for cough. 02/13/19 02/20/19  Agata Lucente C, PA-C  blood glucose meter kit and supplies KIT Dispense  based on patient and insurance preference. Test once a day, fasting. DX E11.65, E11.8 11/29/16   Rutherford Guys, MD  glucose blood test strip For appropriate glucometer, Test once a day, fasting. DX E11.65, E11.8 11/29/16   Rutherford Guys, MD  ketoconazole (NIZORAL) 2 % cream  07/24/18   [provider]  LANCETS MICRO THIN 33G MISC Dispense based on patient and insurance preference. Test once a day, fasting. DX E11.65, E11.8 11/29/16   Rutherford Guys, MD  metFORMIN (GLUCOPHAGE) 1000 MG tablet TAKE 1 TABLET(1000 MG) BY MOUTH TWICE DAILY WITH A MEAL 02/05/19   Rutherford Guys, MD  saxagliptin HCl (ONGLYZA) 5 MG TABS tablet Take 1 tablet (5 mg total) by mouth daily. 02/05/19   Rutherford Guys, MD    Family History Family History  Problem Relation Age of Onset  . Cancer Father     Social History Social History   Tobacco Use  . Smoking status: Never Smoker  . Smokeless tobacco: Never Used  Substance Use Topics  . Alcohol use: No  . Drug use: No     Allergies   Patient has no known allergies.   Review of Systems Review of Systems  Constitutional: Negative for activity change, appetite change, chills, fatigue and fever.  HENT: Negative for congestion, ear pain, rhinorrhea, sinus pressure, sore throat and trouble swallowing.   Eyes: Negative for discharge and redness.  Respiratory: Positive for  cough. Negative for chest tightness and shortness of breath.   Cardiovascular: Positive for chest pain.  Gastrointestinal: Negative for abdominal pain, diarrhea, nausea and vomiting.  Musculoskeletal: Negative for myalgias.  Skin: Negative for rash.  Neurological: Negative for dizziness, light-headedness and headaches.     Physical Exam Triage Vital Signs ED Triage Vitals  Enc Vitals Group     BP 02/13/19 1451 115/75     Pulse Rate 02/13/19 1451 99     Resp 02/13/19 1451 (!) 22     Temp 02/13/19 1454 (!) 101.1 F (38.4 C)     Temp Source 02/13/19 1454 Oral     SpO2  02/13/19 1451 94 %     Weight --      Height --      Head Circumference --      Peak Flow --      Pain Score 02/13/19 1450 0     Pain Loc --      Pain Edu? --      Excl. in [place]? --    No data found.  Updated Vital Signs BP 115/75   Pulse 99   Temp (!) 101.1 F (38.4 C) (Oral)   Resp (!) 22   SpO2 94%   O2 did get up to 95-96 at rest, with ambulation O2 sat dropped to 93%.  Visual Acuity Right Eye Distance:   Left Eye Distance:   Bilateral Distance:    Right Eye Near:   Left Eye Near:    Bilateral Near:     Physical Exam Vitals and nursing note reviewed.  Constitutional:      Appearance: He is well-developed.  HENT:     Head: Normocephalic and atraumatic.     Mouth/Throat:     Comments: Oral mucosa pink and moist, no tonsillar enlargement or exudate. Posterior pharynx patent and nonerythematous, no uvula deviation or swelling. Normal phonation. Eyes:     Conjunctiva/sclera: Conjunctivae normal.  Cardiovascular:     Rate and Rhythm: Normal rate and regular rhythm.     Heart sounds: No murmur.  Pulmonary:     Effort: Pulmonary effort is normal. No respiratory distress.     Breath sounds: Normal breath sounds.     Comments: Breathing comfortably at rest, CTABL, no wheezing, faint rales noted to bilateral bases no other adventitious sounds auscultated Abdominal:     Palpations: Abdomen is soft.     Tenderness: There is no abdominal tenderness.  Musculoskeletal:     Cervical back: Neck supple.  Skin:    General: Skin is warm and dry.  Neurological:     Mental Status: He is alert.      UC Treatments / Results  Labs (all labs ordered are listed, but only abnormal results are displayed) Labs Reviewed - No data to display  EKG   Radiology DG Chest 2 View  Result Date: 02/13/2019 CLINICAL DATA:  Cough, chest discomfort, fever, COVID positive EXAM: CHEST - 2 VIEW COMPARISON:  None. FINDINGS: The heart size and mediastinal contours are within normal limits.  Heterogeneous airspace opacity, most conspicuous at the bilateral lung bases. The visualized skeletal structures are unremarkable. IMPRESSION: Heterogeneous airspace opacity, most conspicuous at the bilateral lung bases, consistent with multifocal infection and in keeping with reported diagnosis of COVID. Electronically Signed   By: Eddie Candle M.D.   On: 02/13/2019 15:49    Procedures Procedures (including critical care time)  Medications Ordered in UC Medications  acetaminophen (TYLENOL) tablet 650 mg (650 mg  Oral Given by Other 02/13/19 1459)  dexamethasone (DECADRON) injection 10 mg (10 mg Intramuscular Given 02/13/19 1618)    Initial Impression / Assessment and Plan / UC Course  I have reviewed the triage vital signs and the nursing notes.  Pertinent labs & imaging results that were available during my care of the patient were reviewed by me and considered in my medical decision making (see chart for details).     X-ray suggestive of Covid pneumonia.  Will provide Decadron IM prior to discharge.  Tessalon for cough as needed.  Continue Tylenol and ibuprofen for fever.  Discussed concern of symptoms progressing with patient advised to follow-up in emergency room if developing increased difficulty breathing, chest discomfort.  O2 trending downward, but likely would not meet criteria for admission at this time.  Discussed strict return precautions. Patient verbalized understanding and is agreeable with plan.  Final Clinical Impressions(s) / UC Diagnoses   Final diagnoses:  Pneumonia due to COVID-19 virus     Discharge Instructions     You pneumonia from the COVID virus. Antibiotics will not help this. We are giving you decadron today Continue to use tessalon/benzonatate for cough Follow up in emergency room if symptoms worsening or developing increased shortness of breath, fatigue or dizziness    ED Prescriptions    Medication Sig Dispense Auth. Provider   benzonatate  (TESSALON) 200 MG capsule Take 1 capsule (200 mg total) by mouth 3 (three) times daily as needed for up to 7 days for cough. 28 capsule Anaiz Qazi, [place] C, PA-C     PDMP not reviewed this encounter.   Aniyla Harling, [place] C, PA-C 02/14/19 1000

## 2019-08-10 ENCOUNTER — Other Ambulatory Visit: Payer: Self-pay | Admitting: Family Medicine

## 2019-08-10 NOTE — Telephone Encounter (Signed)
Requested Prescriptions  Pending Prescriptions Disp Refills   atorvastatin (LIPITOR) 80 MG tablet [Pharmacy Med Name: ATORVASTATIN 80MG  TABLETS] 90 tablet 3    Sig: TAKE 1 TABLET(80 MG) BY MOUTH DAILY     Cardiovascular:  Antilipid - Statins Failed - 08/10/2019  9:07 AM      Failed - Total Cholesterol in normal range and within 360 days    Cholesterol, Total  Date Value Ref Range Status  08/07/2018 177 100 - 199 mg/dL Final         Failed - LDL in normal range and within 360 days    LDL Calculated  Date Value Ref Range Status  08/07/2018 Comment 0 - 99 mg/dL Final    Comment:    Triglyceride result indicated is too high for an accurate LDL cholesterol estimation.          Failed - HDL in normal range and within 360 days    HDL  Date Value Ref Range Status  08/07/2018 29 (L) >39 mg/dL Final         Failed - Triglycerides in normal range and within 360 days    Triglycerides  Date Value Ref Range Status  08/07/2018 401 (H) 0 - 149 mg/dL Final         Passed - Patient is not pregnant      Passed - Valid encounter within last 12 months    Recent Outpatient Visits          6 months ago Controlled type 2 diabetes mellitus with other specified complication, without long-term current use of insulin (HCC)   Primary Care at [facility], [facility], MD   1 year ago Controlled type 2 diabetes mellitus with other specified complication, without long-term current use of insulin [facility])   Primary Care at [facility], Oneita Jolly, MD   1 year ago Type 2 diabetes mellitus with complication, without long-term current use of insulin [facility])   Primary Care at [facility], Oneita Jolly, MD   1 year ago Type 2 diabetes mellitus with complication, without long-term current use of insulin [facility])   Primary Care at [facility], Oneita Jolly, MD   2 years ago Type 2 diabetes mellitus with complication, without long-term current use of insulin [facility])   Primary Care at [facility], Oneita Jolly, MD

## 2020-02-09 ENCOUNTER — Telehealth: Payer: Self-pay | Admitting: Family Medicine

## 2020-02-09 ENCOUNTER — Other Ambulatory Visit: Payer: Self-pay | Admitting: Emergency Medicine

## 2020-02-09 DIAGNOSIS — E1169 Type 2 diabetes mellitus with other specified complication: Secondary | ICD-10-CM

## 2020-02-09 MED ORDER — METFORMIN HCL 1000 MG PO TABS: ORAL_TABLET | ORAL | 0 refills | Status: DC

## 2020-02-09 NOTE — Telephone Encounter (Signed)
Pt came in and is needing a med refill on   metFORMIN (GLUCOPHAGE) 1000 MG tablet [[id number]]   Pt has an appt with Dr. Alvy Bimler tomorrow 02/10/20 at 2pm for this.  [id number] - [place], [place] - [address] AT [facility] [address]  Please advise.

## 2020-02-10 ENCOUNTER — Ambulatory Visit (INDEPENDENT_AMBULATORY_CARE_PROVIDER_SITE_OTHER): Payer: 59 | Admitting: Emergency Medicine

## 2020-02-10 ENCOUNTER — Other Ambulatory Visit: Payer: Self-pay

## 2020-02-10 ENCOUNTER — Encounter: Payer: Self-pay | Admitting: Emergency Medicine

## 2020-02-10 VITALS — BP 151/78 | HR 79 | Temp 98.0°F | Ht 64.0 in | Wt 200.0 lb

## 2020-02-10 DIAGNOSIS — Z7689 Persons encountering health services in other specified circumstances: Secondary | ICD-10-CM | POA: Diagnosis not present

## 2020-02-10 DIAGNOSIS — E1165 Type 2 diabetes mellitus with hyperglycemia: Secondary | ICD-10-CM | POA: Insufficient documentation

## 2020-02-10 DIAGNOSIS — E1169 Type 2 diabetes mellitus with other specified complication: Secondary | ICD-10-CM

## 2020-02-10 DIAGNOSIS — L723 Sebaceous cyst: Secondary | ICD-10-CM

## 2020-02-10 DIAGNOSIS — E118 Type 2 diabetes mellitus with unspecified complications: Secondary | ICD-10-CM | POA: Diagnosis not present

## 2020-02-10 DIAGNOSIS — E785 Hyperlipidemia, unspecified: Secondary | ICD-10-CM

## 2020-02-10 LAB — POCT GLYCOSYLATED HEMOGLOBIN (HGB A1C): Hemoglobin A1C: 8.8 % — AB (ref 4.0–5.6)

## 2020-02-10 LAB — GLUCOSE, POCT (MANUAL RESULT ENTRY): POC Glucose: 178 mg/dl — AB (ref 70–99)

## 2020-02-10 MED ORDER — SAXAGLIPTIN HCL 5 MG PO TABS: 5.0000 mg | ORAL_TABLET | Freq: Every day | ORAL | 3 refills | Status: DC

## 2020-02-10 NOTE — Assessment & Plan Note (Signed)
Uncontrolled diabetes with hemoglobin A1c at 8.8.  Continue Metformin 1000 mg twice a day and start saxagliptin 5 mg daily. Diet and nutrition discussed. Continue atorvastatin 80 mg daily. Follow-up in 3 months.

## 2020-02-10 NOTE — Progress Notes (Addendum)
[address] 54 y.o.   Chief Complaint  Patient presents with  . Diabetes    Check each am 142   . hard knot on left shoulder    Comes and goes for 6 months now    HISTORY OF PRESENT ILLNESS: This is a 54 y.o. male with history of diabetes here for follow-up. Used to see Dr. Pamella Pert.  Here to establish care with me. Also complaining of left shoulder mass for the past 6 months. No other complaints or medical concerns.  HPI   Prior to Admission medications   Medication Sig Start Date End Date Taking? Authorizing Provider  atorvastatin (LIPITOR) 80 MG tablet TAKE 1 TABLET(80 MG) BY MOUTH DAILY 08/10/19  Yes Rutherford Guys, MD  blood glucose meter kit and supplies KIT Dispense based on patient and insurance preference. Test once a day, fasting. DX E11.65, E11.8 11/29/16  Yes Pamella Pert, Irma M, MD  glucose blood test strip For appropriate glucometer, Test once a day, fasting. DX E11.65, E11.8 11/29/16  Yes Rutherford Guys, MD  ketoconazole (NIZORAL) 2 % cream  07/24/18  Yes [provider]  LANCETS MICRO THIN 33G MISC Dispense based on patient and insurance preference. Test once a day, fasting. DX E11.65, E11.8 11/29/16  Yes Rutherford Guys, MD  metFORMIN (GLUCOPHAGE) 1000 MG tablet TAKE 1 TABLET(1000 MG) BY MOUTH TWICE DAILY WITH A MEAL 02/09/20  Yes Leniya Breit, Ines Bloomer, MD  saxagliptin HCl (ONGLYZA) 5 MG TABS tablet Take 1 tablet (5 mg total) by mouth daily. Patient not taking: Reported on 02/10/2020 02/05/19   Rutherford Guys, MD    No Known Allergies  Patient Active Problem List   Diagnosis Date Noted  . Mixed hyperlipidemia due to type 2 diabetes mellitus ([place]) 02/05/2019  . H/O diabetic foot ulcer 08/07/2018  . Diabetic ulcer of left midfoot associated with type 2 diabetes mellitus, limited to breakdown of skin ([place]) 04/07/2018  . Pre-ulcerative corn or callous 03/16/2018  . Contracture of left Achilles tendon 02/10/2018  . Diabetic peripheral neuropathy ([place])  02/10/2018  . Diabetes mellitus type II, controlled ([place]) 06/18/2014    Past Medical History:  Diagnosis Date  . Diabetes mellitus without complication ([place])   . Osteomyelitis of foot ([place]) 11/2016   right foot    History reviewed. No pertinent surgical history.  Social History   Socioeconomic History  . Marital status: Married    Spouse name: Not on file  . Number of children: Not on file  . Years of education: Not on file  . Highest education level: Not on file  Occupational History  . Not on file  Tobacco Use  . Smoking status: Never Smoker  . Smokeless tobacco: Never Used  Substance and Sexual Activity  . Alcohol use: No  . Drug use: No  . Sexual activity: Not on file  Other Topics Concern  . Not on file  Social History Narrative  . Not on file   Social Determinants of Health   Financial Resource Strain: Not on file  Food Insecurity: Not on file  Transportation Needs: Not on file  Physical Activity: Not on file  Stress: Not on file  Social Connections: Not on file  Intimate Partner Violence: Not on file    Family History  Problem Relation Age of Onset  . Cancer Father      Review of Systems  Constitutional: Negative.  Negative for chills and fever.  HENT: Negative.  Negative for congestion and sore throat.  Respiratory: Negative.  Negative for cough and shortness of breath.   Cardiovascular: Negative.  Negative for chest pain and palpitations.  Gastrointestinal: Negative.  Negative for abdominal pain, diarrhea, nausea and vomiting.  Genitourinary: Negative.  Negative for dysuria and hematuria.  Musculoskeletal: Negative.   Skin: Negative.  Negative for rash.  Neurological: Negative.  Negative for dizziness and headaches.  All other systems reviewed and are negative.  Today's Vitals   02/10/20 1348  BP: (!) 151/78  Pulse: 79  Temp: 98 F (36.7 C)  SpO2: 98%  Weight: 200 lb (90.7 kg)  Height: 5\' 4"  (1.626 m)   Body mass index is 34.33  kg/m.   Physical Exam Vitals reviewed.  Constitutional:      Appearance: Normal appearance.  HENT:     Head: Normocephalic.  Eyes:     Extraocular Movements: Extraocular movements intact.     Conjunctiva/sclera: Conjunctivae normal.     Pupils: Pupils are equal, round, and reactive to light.  Cardiovascular:     Rate and Rhythm: Normal rate and regular rhythm.     Pulses: Normal pulses.     Heart sounds: Normal heart sounds.  Pulmonary:     Effort: Pulmonary effort is normal.     Breath sounds: Normal breath sounds.  Musculoskeletal:        General: Normal range of motion.     Cervical back: Normal range of motion and neck supple.  Skin:    General: Skin is warm and dry.     Comments: Sebaceous cyst to upper back.  Nontender and noninfected.  No erythema.  Neurological:     General: No focal deficit present.     Mental Status: He is alert and oriented to person, place, and time.  Psychiatric:        Mood and Affect: Mood normal.        Behavior: Behavior normal.     Results for orders placed or performed in visit on 02/10/20 (from the past 24 hour(s))  POCT glucose (manual entry)     Status: Abnormal   Collection Time: 02/10/20  2:16 PM  Result Value Ref Range   POC Glucose 178 (A) 70 - 99 mg/dl  POCT glycosylated hemoglobin (Hb A1C)     Status: Abnormal   Collection Time: 02/10/20  2:24 PM  Result Value Ref Range   Hemoglobin A1C 8.8 (A) 4.0 - 5.6 %   HbA1c POC (<> result, manual entry)     HbA1c, POC (prediabetic range)     HbA1c, POC (controlled diabetic range)      ASSESSMENT & PLAN: Uncontrolled type 2 diabetes mellitus with hyperglycemia (HCC) Uncontrolled diabetes with hemoglobin A1c at 8.8.  Continue Metformin 1000 mg twice a day and start saxagliptin 5 mg daily. Diet and nutrition discussed. Continue atorvastatin 80 mg daily. Follow-up in 3 months.  Dantae was seen today for diabetes and hard knot on left shoulder.  Diagnoses and all orders for this  visit:  Uncontrolled type 2 diabetes mellitus with hyperglycemia (HCC) -     saxagliptin HCl (ONGLYZA) 5 MG TABS tablet; Take 1 tablet (5 mg total) by mouth daily.  Type 2 diabetes mellitus with complication, without long-term current use of insulin (HCC) -     POCT glycosylated hemoglobin (Hb A1C) -     POCT glucose (manual entry) -     Lipid panel -     CMP14+EGFR -     Microalbumin / creatinine urine ratio  Encounter to establish  care  Dyslipidemia associated with type 2 diabetes mellitus ([place])  Sebaceous cyst Comments: Upper back     Patient Instructions       If you have lab work done today you will be contacted with your lab results within the next 2 weeks.  If you have not heard from [place] then please contact us. The fastest way to get your results is to register for My Chart.   IF you received an x-ray today, you will receive an invoice from [facility]. Please contact [facility] at [phone] with questions or concerns regarding your invoice.   IF you received labwork today, you will receive an invoice from [facility]. Please contact [facility] at [phone] with questions or concerns regarding your invoice.   Our billing staff will not be able to assist you with questions regarding bills from these companies.  You will be contacted with the lab results as soon as they are available. The fastest way to get your results is to activate your My Chart account. Instructions are located on the last page of this paperwork. If you have not heard from [place] regarding the results in 2 weeks, please contact this office.      Diabetes mellitus y nutricin, en adultos Diabetes Mellitus and Nutrition, Adult Si sufre de diabetes (diabetes mellitus), es muy importante tener hbitos alimenticios saludables debido a que sus niveles de Designer, television/film set sangre (glucosa) se ven afectados en gran medida por lo que come y bebe. [place] alimentos saludables en las cantidades  [place], aproximadamente a la [employer], [place] ayudar a:  Aeronautical engineer glucemia.  Disminuir el riesgo de sufrir una enfermedad cardaca.  Mejorar la presin arterial.  Science writer o mantener un peso saludable. Todas las personas que sufren de diabetes son diferentes y cada una tiene necesidades diferentes en cuanto a un plan de alimentacin. El mdico puede recomendarle que trabaje con un especialista en dietas y nutricin (nutricionista) para Financial trader plan para usted. Su plan de alimentacin puede variar segn factores como:  Las caloras que necesita.  Los medicamentos que toma.  Su peso.  Sus niveles de glucemia, presin arterial y colesterol.  Su nivel de [place].  Otras afecciones que tenga, como enfermedades cardacas o renales. Cmo me afectan los carbohidratos? Los carbohidratos, o hidratos de carbono, afectan su nivel de glucemia ms que cualquier otro tipo de alimento. La ingesta de carbohidratos naturalmente aumenta la cantidad de [employer]. El recuento de carbohidratos es un mtodo destinado a Catering manager un registro de la cantidad de carbohidratos que se consumen. El recuento de carbohidratos es importante para Theatre manager la glucemia a un nivel saludable, especialmente si utiliza insulina o toma determinados medicamentos por va oral para la diabetes. Es importante conocer la cantidad de carbohidratos que se pueden ingerir en cada comida sin correr Engineer, manufacturing. Esto es Psychologist, forensic. Su nutricionista puede ayudarlo a calcular la cantidad de carbohidratos que debe ingerir en cada comida y en cada refrigerio. Entre los alimentos que contienen carbohidratos, se incluyen:  Pan, cereal, arroz, pastas y galletas.  Papas y maz.  Guisantes, frijoles y lentejas.  Leche y Estate agent.  Lambert Mody y [place].  Postres, como pasteles, galletas, helado y caramelos. Cmo me afecta el alcohol? El alcohol puede provocar disminuciones sbitas de la  glucemia (hipoglucemia), especialmente si utiliza insulina o toma determinados medicamentos por va oral para la diabetes. La hipoglucemia es Herbalist mortal. Los sntomas de la hipoglucemia (somnolencia, mareos  y confusin) son similares a los sntomas de haber consumido demasiado alcohol. Si el mdico afirma que el alcohol es seguro para usted, [place] estas pautas:  Limite el consumo de alcohol a no ms de 20mdida por da si es mujer y no est e[place]amrock Colony y a 255midas si es hombre. Una medida equivale a 12oz (35561mde cerveza, 5oz (148m8me vino o 1oz (44ml60m bebidas alcohlicas de alta graduacin.  No beba con el estmago vaco.  Mantngase hidratado bebiendo agua, refrescos dietticos o t helado sin azcar.  Tenga en cuenta que los refrescos comunes, los jugos y otras bebida para mezclOptician, dispensingen contener mucha azcar y se deben contar como carbohidratos. Cules son algunos consejos para seguir este plan?  Leer las etiquetas de los alimentos  Comience por leer el tamao de la porcin en la "Informacin nutricional" en las etiquetas de los alimentos envasados y las bebidas. La cantidad de caloras, carbohidratos, grasas y otros nutrientes mencionados en la etiqueta se basan en una porcin del alimento. Muchos alimentos contienen ms de una porcin por envase.  Verifique la cantidad total de gramos (g) de carbohidratos totales en una porcin. Puede calcular la cantidad de porciones de carbohidratos al dividir el total de carbohidratos por 15. Por ejemplo, si un alimento tiene un total de 30g de carbohidratos, equivale a 2 porciones de carbohidratos.  Verifique la cantidad de gramos (g) de grasas saturadas y grasas trans en una porcin. Escoja alimentos que no contengan grasa o que tengan un bajo contenido.  Verifique la cantidad de miligramos (mg) de sal (sodio) en una porcin. La mayorState Farmas personas deben limitar la ingesta de sodio total a menos de 2300mg 20m da.  STraining and development officermpre consulte la informacin nutricional de los alimentos etiquetados como "con bajo contenido de grasa" o "sin grasa". Estos alimentos pueden tener un mayor contenido de azcar Location managerada o carbohidratos refinados, y deben evitarse.  Hable con su nutricionista para identificar sus objetivos diarios en cuanto a los nutrientes mencionados en la etiqueta. Al ir de compras  Evite comprar alimentos procesados, enlatados o precocinados. Estos alimentos tienden a tener Special educational needs teacher cantidad de grasa,[place] y azcar agregada.  Compre en la zona exterior de la tienda de comestibles. Esta zona incluye frutas y verduras frescas, granos a granel, carnes frescas y productos lcteos frescos. Al cocinar  Utilice mtodos de coccin a baja temperatura, como hornear, en lugar de mtodos de coccin a alta temperatura, como frer en abundante aceite.  Cocine con aceites saludables, como el aceite de oliva,[place] o girasoBodega Bayte cocinar con manteca, crema o carnes con alto contenido de grasa. Planificacin de las comidas  Coma las comidas y los refrigerios regularmente, preferentemente a la misma hora todos los daHicoe pasar largos perodos de tiempo sin comer.  Consuma alimentos ricos en fibra, como frutas frescas, verduras, frijoles y cereales integrales. Consulte a su nutricionista sobre cuntas porciones de carbohidratos puede consumir en cada comida.  Consuma entre 4 y 6 onzas (oz) de protenas magras por da, como carnes magrasAllenporto, pescado, huevos o tofu. Una onza de protena magra equivale a: ? 1 onza de carne, pollo o pescado. ? 1huevo. ?  taza de tofu.  Coma algunos alimentos por da que contengan grasas saludables, como aguacates, frutos secos, semillas y pescado. Estilo de vida  Controle su nivel de glucemia con regularidad.  Haga actividad fsica habitualmente como se lo haya indicado el mdico. Esto puede incluir lo siguiente: ? 150minu15msemanales de ejercicio  de  intensidad moderada o alta. Esto podra incluir caminatas dinmicas, ciclismo o gimnasia acutica. ? Realizar ejercicios de elongacin y de fortalecimiento, como yoga o levantamiento de pesas, por lo menos 2veces por semana.  Tome los [employer] se lo haya indicado el mdico.  No consuma ningn producto que contenga nicotina o tabaco, como cigarrillos y Psychologist, sport and exercise. Si necesita ayuda para dejar de fumar, consulte al [employer] con un asesor o instructor en diabetes para identificar estrategias para controlar el estrs y cualquier desafo emocional y social. Preguntas para hacerle al mdico  Es necesario que consulte a Radio broadcast assistant en el cuidado de la diabetes?  Es necesario que me rena con un nutricionista?  A qu nmero puedo llamar si tengo preguntas?  Cules son los mejores momentos para controlar la glucemia? Dnde encontrar ms informacin:  Asociacin Estadounidense de la Diabetes (American Diabetes Association): diabetes.org  Academia de Nutricin y Information systems manager (Academy of Nutrition and Dietetics): www.eatright.[place] Diabetes y las Enfermedades Digestivas y [place] [facility] of Diabetes and Digestive and Kidney Diseases, NIH): [employer]inesFuneral.dk Resumen  Un plan de alimentacin saludable lo ayudar a Aeronautical engineer glucemia y Theatre manager un estilo de vida saludable.  Trabajar con un especialista en dietas y nutricin (nutricionista) puede ayudarlo a Insurance claims handler de alimentacin para usted.  Tenga en cuenta que los carbohidratos (hidratos de carbono) y el alcohol tienen efectos inmediatos en sus niveles de glucemia. Es importante contar los carbohidratos que ingiere y consumir alcohol con prudencia. Esta informacin no tiene Marine scientist el consejo del mdico. Asegrese de hacerle al mdico cualquier pregunta que tenga. Document Revised: 10/29/2016 Document Reviewed: 06/10/2016 Elsevier Patient  Education  2020 Elsevier Inc.      Agustina Caroli, MD [facility]

## 2020-02-10 NOTE — Patient Instructions (Addendum)
   If you have lab work done today you will be contacted with your lab results within the next 2 weeks.  If you have not heard from us then please contact us. The fastest way to get your results is to register for My Chart.   IF you received an x-ray today, you will receive an invoice from [facility]. Please contact [facility] at [phone] with questions or concerns regarding your invoice.   IF you received labwork today, you will receive an invoice from [facility]. Please contact [facility] at [phone] with questions or concerns regarding your invoice.   Our billing staff will not be able to assist you with questions regarding bills from these companies.  You will be contacted with the lab results as soon as they are available. The fastest way to get your results is to activate your My Chart account. Instructions are located on the last page of this paperwork. If you have not heard from us regarding the results in 2 weeks, please contact this office.     Diabetes mellitus y nutricin, en adultos Diabetes Mellitus and Nutrition, Adult Si sufre de diabetes (diabetes mellitus), es muy importante tener hbitos alimenticios saludables debido a que sus niveles de azcar en la sangre (glucosa) se ven afectados en gran medida por lo que come y bebe. [place] alimentos saludables en las cantidades adecuadas, aproximadamente a la misma hora todos los das, lo ayudar a:  Controlar la glucemia.  Disminuir el riesgo de sufrir una enfermedad cardaca.  Mejorar la presin arterial.  Alcanzar o mantener un peso saludable. Todas las personas que sufren de diabetes son diferentes y cada una tiene necesidades diferentes en cuanto a un plan de alimentacin. El mdico puede recomendarle que trabaje con un especialista en dietas y nutricin (nutricionista) para elaborar el mejor plan para usted. Su plan de alimentacin puede variar segn factores como:  Las caloras que  necesita.  Los medicamentos que toma.  Su peso.  Sus niveles de glucemia, presin arterial y colesterol.  Su nivel de actividad.  Otras afecciones que tenga, como enfermedades cardacas o renales. Cmo me afectan los carbohidratos? Los carbohidratos, o hidratos de carbono, afectan su nivel de glucemia ms que cualquier otro tipo de alimento. La ingesta de carbohidratos naturalmente aumenta la cantidad de glucosa en la sangre. El recuento de carbohidratos es un mtodo destinado a llevar un registro de la cantidad de carbohidratos que se consumen. El recuento de carbohidratos es importante para mantener la glucemia a un nivel saludable, especialmente si utiliza insulina o toma determinados medicamentos por va oral para la diabetes. Es importante conocer la cantidad de carbohidratos que se pueden ingerir en cada comida sin correr ningn riesgo. Esto es diferente en cada persona. Su nutricionista puede ayudarlo a calcular la cantidad de carbohidratos que debe ingerir en cada comida y en cada refrigerio. Entre los alimentos que contienen carbohidratos, se incluyen:  Pan, cereal, arroz, pastas y galletas.  Papas y maz.  Guisantes, frijoles y lentejas.  Leche y yogur.  Frutas y jugo.  Postres, como pasteles, galletas, helado y caramelos. Cmo me afecta el alcohol? El alcohol puede provocar disminuciones sbitas de la glucemia (hipoglucemia), especialmente si utiliza insulina o toma determinados medicamentos por va oral para la diabetes. La hipoglucemia es una afeccin potencialmente mortal. Los sntomas de la hipoglucemia (somnolencia, mareos y confusin) son similares a los sntomas de haber consumido demasiado alcohol. Si el mdico afirma que el alcohol es seguro para usted, siga estas pautas:    Limite el consumo de alcohol a no ms de 1medida por da si es mujer y no est embarazada, y a 2medidas si es hombre. Una medida equivale a 12oz (355ml) de cerveza, 5oz (148ml) de vino o  1oz (44ml) de bebidas alcohlicas de alta graduacin.  No beba con el estmago vaco.  Mantngase hidratado bebiendo agua, refrescos dietticos o t helado sin azcar.  Tenga en cuenta que los refrescos comunes, los jugos y otras bebida para mezclar pueden contener mucha azcar y se deben contar como carbohidratos. Cules son algunos consejos para seguir este plan?  Leer las etiquetas de los alimentos  Comience por leer el tamao de la porcin en la "Informacin nutricional" en las etiquetas de los alimentos envasados y las bebidas. La cantidad de caloras, carbohidratos, grasas y otros nutrientes mencionados en la etiqueta se basan en una porcin del alimento. Muchos alimentos contienen ms de una porcin por envase.  Verifique la cantidad total de gramos (g) de carbohidratos totales en una porcin. Puede calcular la cantidad de porciones de carbohidratos al dividir el total de carbohidratos por 15. Por ejemplo, si un alimento tiene un total de 30g de carbohidratos, equivale a 2 porciones de carbohidratos.  Verifique la cantidad de gramos (g) de grasas saturadas y grasas trans en una porcin. Escoja alimentos que no contengan grasa o que tengan un bajo contenido.  Verifique la cantidad de miligramos (mg) de sal (sodio) en una porcin. La mayora de las personas deben limitar la ingesta de sodio total a menos de 2300mg por da.  Siempre consulte la informacin nutricional de los alimentos etiquetados como "con bajo contenido de grasa" o "sin grasa". Estos alimentos pueden tener un mayor contenido de azcar agregada o carbohidratos refinados, y deben evitarse.  Hable con su nutricionista para identificar sus objetivos diarios en cuanto a los nutrientes mencionados en la etiqueta. Al ir de compras  Evite comprar alimentos procesados, enlatados o precocinados. Estos alimentos tienden a tener una mayor cantidad de grasa, sodio y azcar agregada.  Compre en la zona exterior de la tienda de  comestibles. Esta zona incluye frutas y verduras frescas, granos a granel, carnes frescas y productos lcteos frescos. Al cocinar  Utilice mtodos de coccin a baja temperatura, como hornear, en lugar de mtodos de coccin a alta temperatura, como frer en abundante aceite.  Cocine con aceites saludables, como el aceite de oliva, canola o girasol.  Evite cocinar con manteca, crema o carnes con alto contenido de grasa. Planificacin de las comidas  Coma las comidas y los refrigerios regularmente, preferentemente a la misma hora todos los das. Evite pasar largos perodos de tiempo sin comer.  Consuma alimentos ricos en fibra, como frutas frescas, verduras, frijoles y cereales integrales. Consulte a su nutricionista sobre cuntas porciones de carbohidratos puede consumir en cada comida.  Consuma entre 4 y 6 onzas (oz) de protenas magras por da, como carnes magras, pollo, pescado, huevos o tofu. Una onza de protena magra equivale a: ? 1 onza de carne, pollo o pescado. ? 1huevo. ?  taza de tofu.  Coma algunos alimentos por da que contengan grasas saludables, como aguacates, frutos secos, semillas y pescado. Estilo de vida  Controle su nivel de glucemia con regularidad.  Haga actividad fsica habitualmente como se lo haya indicado el mdico. Esto puede incluir lo siguiente: ? 150minutos semanales de ejercicio de intensidad moderada o alta. Esto podra incluir caminatas dinmicas, ciclismo o gimnasia acutica. ? Realizar ejercicios de elongacin y de fortalecimiento, como yoga o levantamiento   de pesas, por lo menos 2veces por semana.  Tome los medicamentos como se lo haya indicado el mdico.  No consuma ningn producto que contenga nicotina o tabaco, como cigarrillos y cigarrillos electrnicos. Si necesita ayuda para dejar de fumar, consulte al mdico.  Trabaje con un asesor o instructor en diabetes para identificar estrategias para controlar el estrs y cualquier desafo emocional  y social. Preguntas para hacerle al mdico  Es necesario que consulte a un instructor en el cuidado de la diabetes?  Es necesario que me rena con un nutricionista?  A qu nmero puedo llamar si tengo preguntas?  Cules son los mejores momentos para controlar la glucemia? Dnde encontrar ms informacin:  Asociacin Estadounidense de la Diabetes (American Diabetes Association): diabetes.org  Academia de Nutricin y Diettica (Academy of Nutrition and Dietetics): www.eatright.org  Instituto Nacional de la Diabetes y las Enfermedades Digestivas y Renales (National Institute of Diabetes and Digestive and Kidney Diseases, NIH): www.niddk.nih.gov Resumen  Un plan de alimentacin saludable lo ayudar a controlar la glucemia y mantener un estilo de vida saludable.  Trabajar con un especialista en dietas y nutricin (nutricionista) puede ayudarlo a elaborar el mejor plan de alimentacin para usted.  Tenga en cuenta que los carbohidratos (hidratos de carbono) y el alcohol tienen efectos inmediatos en sus niveles de glucemia. Es importante contar los carbohidratos que ingiere y consumir alcohol con prudencia. Esta informacin no tiene como fin reemplazar el consejo del mdico. Asegrese de hacerle al mdico cualquier pregunta que tenga. Document Revised: 10/29/2016 Document Reviewed: 06/10/2016 Elsevier Patient Education  2020 [employer].  

## 2020-02-11 LAB — CMP14+EGFR
ALT: 13 IU/L (ref 0–44)
AST: 9 IU/L (ref 0–40)
Albumin/Globulin Ratio: 1.4 (ref 1.2–2.2)
Albumin: 4.3 g/dL (ref 3.8–4.9)
Alkaline Phosphatase: 133 IU/L — ABNORMAL HIGH (ref 44–121)
BUN/Creatinine Ratio: 19 (ref 9–20)
BUN: 14 mg/dL (ref 6–24)
Bilirubin Total: 0.3 mg/dL (ref 0.0–1.2)
CO2: 28 mmol/L (ref 20–29)
Calcium: 9.2 mg/dL (ref 8.7–10.2)
Chloride: 100 mmol/L (ref 96–106)
Creatinine, Ser: 0.74 mg/dL — ABNORMAL LOW (ref 0.76–1.27)
GFR calc Af Amer: 119 mL/min/{1.73_m2} (ref >59)
GFR calc non Af Amer: 103 mL/min/{1.73_m2} (ref >59)
Globulin, Total: 3 g/dL (ref 1.5–4.5)
Glucose: 154 mg/dL — ABNORMAL HIGH (ref 65–99)
Potassium: 4.7 mmol/L (ref 3.5–5.2)
Sodium: 140 mmol/L (ref 134–144)
Total Protein: 7.3 g/dL (ref 6.0–8.5)

## 2020-02-11 LAB — LIPID PANEL
Chol/HDL Ratio: 5.1 ratio — ABNORMAL HIGH (ref 0.0–5.0)
Cholesterol, Total: 138 mg/dL (ref 100–199)
HDL: 27 mg/dL — ABNORMAL LOW (ref >39)
LDL Chol Calc (NIH): 48 mg/dL (ref 0–99)
Triglycerides: 421 mg/dL — ABNORMAL HIGH (ref 0–149)
VLDL Cholesterol Cal: 63 mg/dL — ABNORMAL HIGH (ref 5–40)

## 2020-02-11 LAB — MICROALBUMIN / CREATININE URINE RATIO
Creatinine, Urine: 132.4 mg/dL
Microalb/Creat Ratio: 22 mg/g creat (ref 0–29)
Microalbumin, Urine: 28.7 ug/mL

## 2020-02-26 IMAGING — DX DG CHEST 2V
2 series · 2 of 2 positions shown · non-contrast
Comparison: None.

CLINICAL DATA: Cough, chest discomfort, fever, COVID positive

EXAM:
CHEST - 2 VIEW

[chest pa]
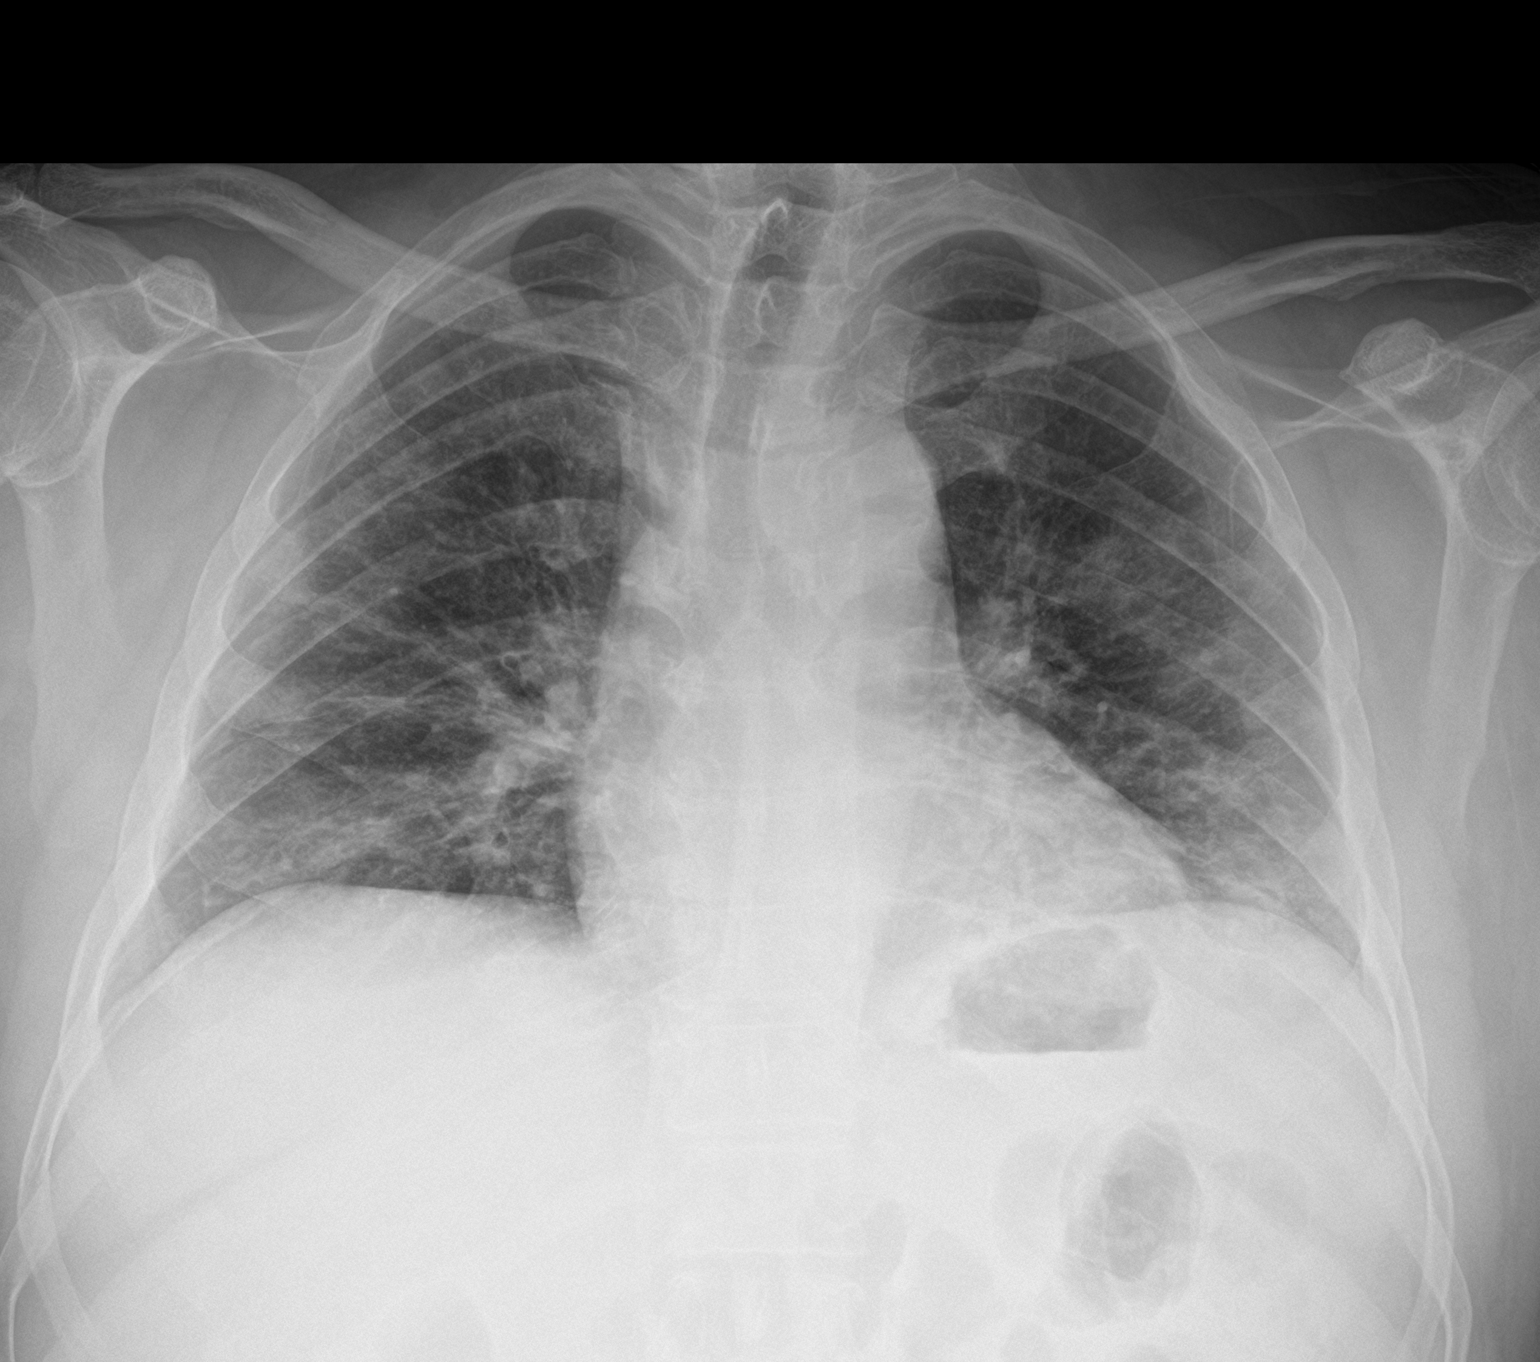

[chest lat]
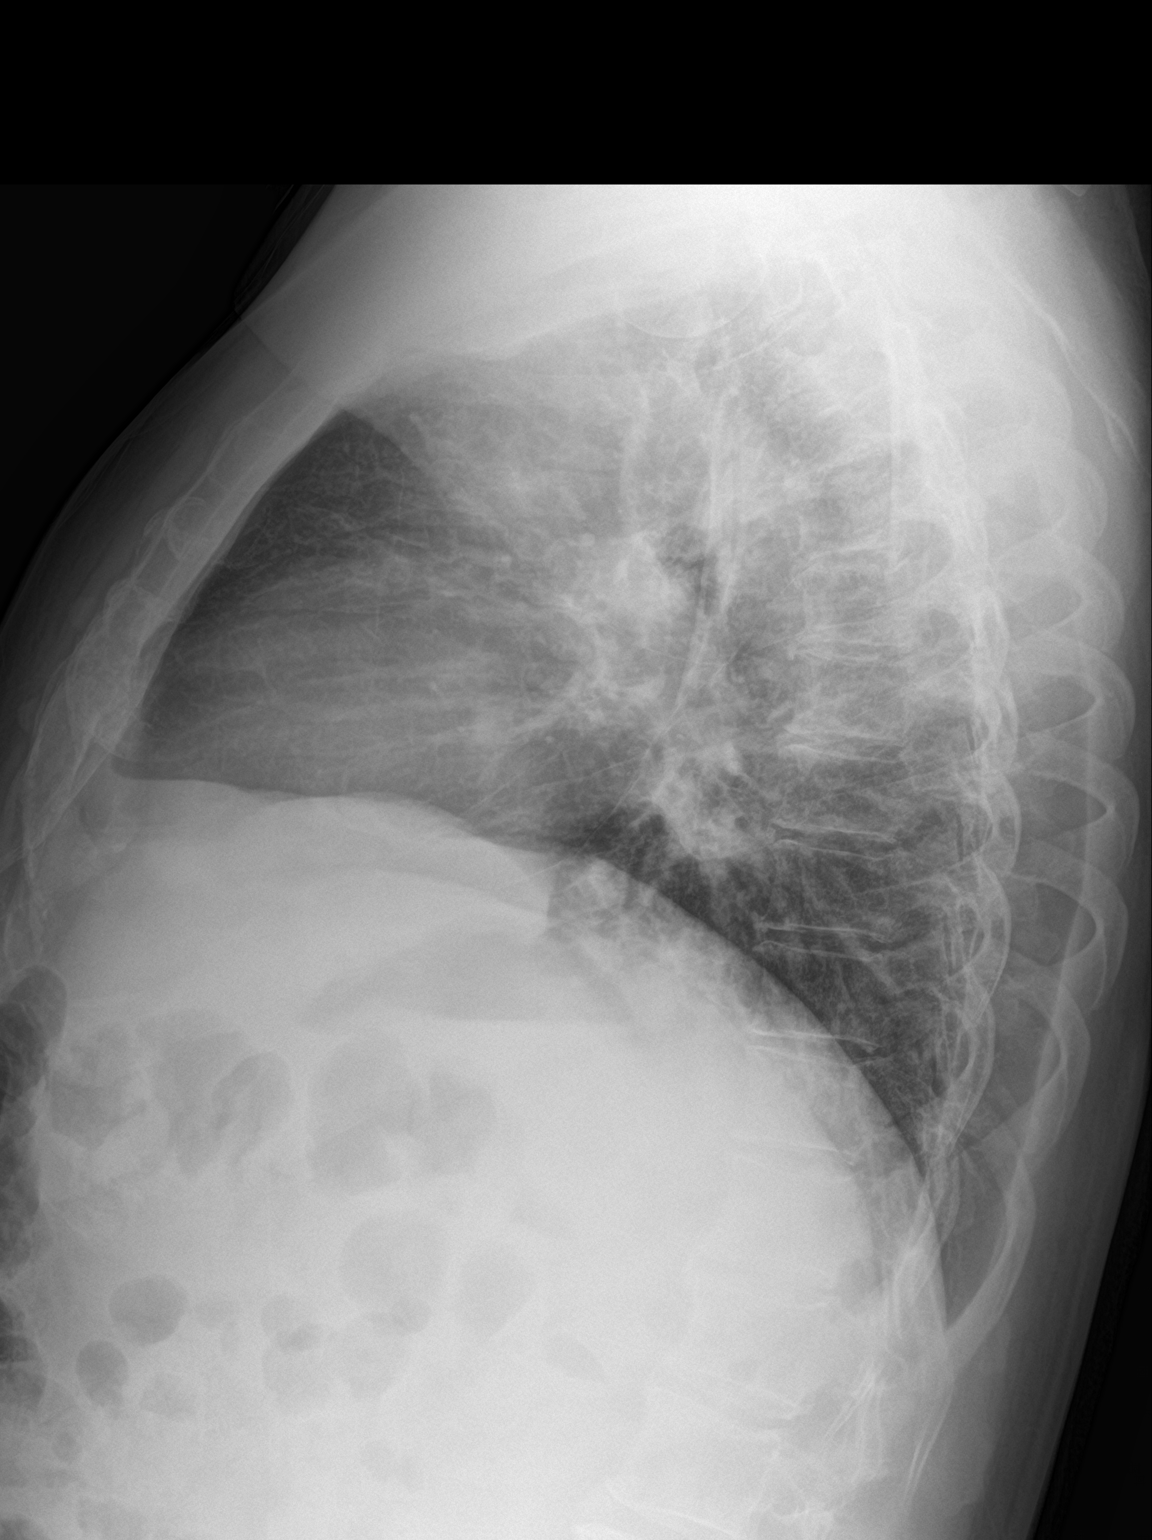

[2 of 2 positions shown; findings below may reference images not displayed]

FINDINGS: The heart size and mediastinal contours are within normal limits.
Heterogeneous airspace opacity, most conspicuous at the bilateral
lung bases. The visualized skeletal structures are unremarkable.
IMPRESSION: Heterogeneous airspace opacity, most conspicuous at the bilateral
lung bases, consistent with multifocal infection and in keeping with
reported diagnosis of COVID.

## 2020-04-12 ENCOUNTER — Other Ambulatory Visit: Payer: Self-pay | Admitting: Emergency Medicine

## 2020-04-12 DIAGNOSIS — E1169 Type 2 diabetes mellitus with other specified complication: Secondary | ICD-10-CM

## 2020-05-03 ENCOUNTER — Ambulatory Visit: Payer: 59 | Admitting: Emergency Medicine

## 2020-07-04 ENCOUNTER — Other Ambulatory Visit: Payer: Self-pay | Admitting: Emergency Medicine

## 2020-07-04 DIAGNOSIS — E1169 Type 2 diabetes mellitus with other specified complication: Secondary | ICD-10-CM

## 2020-09-23 ENCOUNTER — Other Ambulatory Visit: Payer: Self-pay | Admitting: Emergency Medicine

## 2020-09-23 DIAGNOSIS — E1169 Type 2 diabetes mellitus with other specified complication: Secondary | ICD-10-CM

## 2020-12-12 ENCOUNTER — Other Ambulatory Visit: Payer: Self-pay | Admitting: Emergency Medicine

## 2020-12-12 DIAGNOSIS — E1169 Type 2 diabetes mellitus with other specified complication: Secondary | ICD-10-CM

## 2021-03-14 ENCOUNTER — Other Ambulatory Visit: Payer: Self-pay | Admitting: Emergency Medicine

## 2021-03-14 DIAGNOSIS — E1165 Type 2 diabetes mellitus with hyperglycemia: Secondary | ICD-10-CM

## 2021-03-14 DIAGNOSIS — E1169 Type 2 diabetes mellitus with other specified complication: Secondary | ICD-10-CM

## 2021-07-21 ENCOUNTER — Other Ambulatory Visit: Payer: Self-pay | Admitting: Emergency Medicine

## 2021-07-21 DIAGNOSIS — E1169 Type 2 diabetes mellitus with other specified complication: Secondary | ICD-10-CM

## 2021-09-22 ENCOUNTER — Other Ambulatory Visit: Payer: Self-pay | Admitting: Emergency Medicine

## 2021-09-22 DIAGNOSIS — E1169 Type 2 diabetes mellitus with other specified complication: Secondary | ICD-10-CM

## 2021-12-16 ENCOUNTER — Other Ambulatory Visit: Payer: Self-pay | Admitting: Emergency Medicine

## 2021-12-16 DIAGNOSIS — E1169 Type 2 diabetes mellitus with other specified complication: Secondary | ICD-10-CM

## 2022-03-16 ENCOUNTER — Other Ambulatory Visit: Payer: Self-pay | Admitting: Emergency Medicine

## 2022-03-16 DIAGNOSIS — E1169 Type 2 diabetes mellitus with other specified complication: Secondary | ICD-10-CM

## 2022-03-16 DIAGNOSIS — E1165 Type 2 diabetes mellitus with hyperglycemia: Secondary | ICD-10-CM

## 2022-03-22 ENCOUNTER — Other Ambulatory Visit: Payer: Self-pay | Admitting: Emergency Medicine

## 2022-03-22 DIAGNOSIS — E1165 Type 2 diabetes mellitus with hyperglycemia: Secondary | ICD-10-CM

## 2022-05-07 ENCOUNTER — Encounter (INDEPENDENT_AMBULATORY_CARE_PROVIDER_SITE_OTHER): Payer: 59 | Admitting: Ophthalmology

## 2022-05-07 DIAGNOSIS — E113513 Type 2 diabetes mellitus with proliferative diabetic retinopathy with macular edema, bilateral: Secondary | ICD-10-CM | POA: Diagnosis not present

## 2022-05-07 DIAGNOSIS — H43813 Vitreous degeneration, bilateral: Secondary | ICD-10-CM

## 2022-05-24 ENCOUNTER — Encounter (INDEPENDENT_AMBULATORY_CARE_PROVIDER_SITE_OTHER): Payer: 59 | Admitting: Ophthalmology

## 2022-05-24 DIAGNOSIS — E113511 Type 2 diabetes mellitus with proliferative diabetic retinopathy with macular edema, right eye: Secondary | ICD-10-CM | POA: Diagnosis not present

## 2022-06-07 ENCOUNTER — Encounter (INDEPENDENT_AMBULATORY_CARE_PROVIDER_SITE_OTHER): Payer: 59 | Admitting: Ophthalmology

## 2022-06-07 DIAGNOSIS — E113591 Type 2 diabetes mellitus with proliferative diabetic retinopathy without macular edema, right eye: Secondary | ICD-10-CM

## 2022-06-07 DIAGNOSIS — E113512 Type 2 diabetes mellitus with proliferative diabetic retinopathy with macular edema, left eye: Secondary | ICD-10-CM

## 2022-06-07 DIAGNOSIS — H43813 Vitreous degeneration, bilateral: Secondary | ICD-10-CM | POA: Diagnosis not present

## 2022-06-14 ENCOUNTER — Other Ambulatory Visit: Payer: Self-pay | Admitting: Emergency Medicine

## 2022-06-14 DIAGNOSIS — E1169 Type 2 diabetes mellitus with other specified complication: Secondary | ICD-10-CM

## 2022-07-05 ENCOUNTER — Encounter (INDEPENDENT_AMBULATORY_CARE_PROVIDER_SITE_OTHER): Payer: 59 | Admitting: Ophthalmology

## 2022-07-05 DIAGNOSIS — E113591 Type 2 diabetes mellitus with proliferative diabetic retinopathy without macular edema, right eye: Secondary | ICD-10-CM | POA: Diagnosis not present

## 2022-07-05 DIAGNOSIS — Z7984 Long term (current) use of oral hypoglycemic drugs: Secondary | ICD-10-CM | POA: Diagnosis not present

## 2022-07-05 DIAGNOSIS — E113512 Type 2 diabetes mellitus with proliferative diabetic retinopathy with macular edema, left eye: Secondary | ICD-10-CM

## 2022-07-05 DIAGNOSIS — H43813 Vitreous degeneration, bilateral: Secondary | ICD-10-CM | POA: Diagnosis not present

## 2022-07-12 ENCOUNTER — Encounter (INDEPENDENT_AMBULATORY_CARE_PROVIDER_SITE_OTHER): Payer: 59 | Admitting: Ophthalmology

## 2022-07-12 DIAGNOSIS — E113591 Type 2 diabetes mellitus with proliferative diabetic retinopathy without macular edema, right eye: Secondary | ICD-10-CM | POA: Diagnosis not present

## 2022-08-02 ENCOUNTER — Encounter (INDEPENDENT_AMBULATORY_CARE_PROVIDER_SITE_OTHER): Payer: 59 | Admitting: Ophthalmology

## 2022-08-09 ENCOUNTER — Encounter (INDEPENDENT_AMBULATORY_CARE_PROVIDER_SITE_OTHER): Payer: 59 | Admitting: Ophthalmology

## 2022-08-09 DIAGNOSIS — E113512 Type 2 diabetes mellitus with proliferative diabetic retinopathy with macular edema, left eye: Secondary | ICD-10-CM | POA: Diagnosis not present

## 2022-08-09 DIAGNOSIS — E113591 Type 2 diabetes mellitus with proliferative diabetic retinopathy without macular edema, right eye: Secondary | ICD-10-CM

## 2022-08-09 DIAGNOSIS — H43813 Vitreous degeneration, bilateral: Secondary | ICD-10-CM

## 2022-09-09 ENCOUNTER — Encounter (INDEPENDENT_AMBULATORY_CARE_PROVIDER_SITE_OTHER): Payer: 59 | Admitting: Ophthalmology

## 2022-09-09 DIAGNOSIS — E113591 Type 2 diabetes mellitus with proliferative diabetic retinopathy without macular edema, right eye: Secondary | ICD-10-CM | POA: Diagnosis not present

## 2022-09-09 DIAGNOSIS — Z7984 Long term (current) use of oral hypoglycemic drugs: Secondary | ICD-10-CM | POA: Diagnosis not present

## 2022-09-09 DIAGNOSIS — E113512 Type 2 diabetes mellitus with proliferative diabetic retinopathy with macular edema, left eye: Secondary | ICD-10-CM

## 2022-09-09 DIAGNOSIS — H43813 Vitreous degeneration, bilateral: Secondary | ICD-10-CM

## 2022-09-11 ENCOUNTER — Other Ambulatory Visit: Payer: Self-pay | Admitting: Emergency Medicine

## 2022-09-11 DIAGNOSIS — E1169 Type 2 diabetes mellitus with other specified complication: Secondary | ICD-10-CM

## 2022-10-11 ENCOUNTER — Encounter (INDEPENDENT_AMBULATORY_CARE_PROVIDER_SITE_OTHER): Payer: 59 | Admitting: Ophthalmology

## 2022-10-11 DIAGNOSIS — Z7984 Long term (current) use of oral hypoglycemic drugs: Secondary | ICD-10-CM | POA: Diagnosis not present

## 2022-10-11 DIAGNOSIS — E113512 Type 2 diabetes mellitus with proliferative diabetic retinopathy with macular edema, left eye: Secondary | ICD-10-CM

## 2022-10-11 DIAGNOSIS — H43813 Vitreous degeneration, bilateral: Secondary | ICD-10-CM | POA: Diagnosis not present

## 2022-10-11 DIAGNOSIS — H2513 Age-related nuclear cataract, bilateral: Secondary | ICD-10-CM

## 2022-10-11 DIAGNOSIS — E113591 Type 2 diabetes mellitus with proliferative diabetic retinopathy without macular edema, right eye: Secondary | ICD-10-CM | POA: Diagnosis not present

## 2022-11-08 ENCOUNTER — Encounter (INDEPENDENT_AMBULATORY_CARE_PROVIDER_SITE_OTHER): Payer: Self-pay | Admitting: Ophthalmology

## 2023-12-22 NOTE — Discharge Summary (Signed)
 Hospital Medicine Discharge Summary   Demographics: Ethan [name]  58 y.o. [birth year] MRN: [id number]    Extended Emergency Contact Information Primary Emergency Contact: Wands,Jane Home Phone: [phone] Relation: Daughter  Full Code  Admit Date: 12/15/2023                            Attending Physician: Gevena Amelie Friedman, MD Discharge Date: 12/22/2023  Primary Care Provider: PCP Needed PPI   None  Consults during this admission: Consult Orders             IP CONSULT TO INFECTIOUS DISEASES       Specialty:  Infectious Diseases  Provider:  (Not yet assigned)      IP CONSULT TO PODIATRY       Specialty:  Podiatry  Provider:  (Not yet assigned)      IP CONSULT TO ORTHOPEDIC SURGERY       Specialty:  Orthopedic Surgery  Provider:  (Not yet assigned)             Active & Resolved Diagnosis: Principal Problem:   Septic arthritis    (CMD) Active Problems:   Type 2 diabetes mellitus with hyperglycemia, without long-term current use of insulin    (CMD)   Acute necrotizing right diabetic foot infection with septic arthritis   Osteomyelitis of fifth toe of right foot    (CMD)   Osteomyelitis of fourth toe of right foot    (CMD)   Hyperlipidemia Resolved Problems:   Hypomagnesemia  Disposition: Patient discharged to Home with Home Health Care in stable condition.  Discharge follow-up recommendations : See Hospital Course  Scheduled Future Appointments       Provider Department Dept Phone Center   12/29/2023 2:45 PM Patriciaann Roers [facility] - [facility] [phone] [facility]   01/06/2024 10:00 AM Odella Croft [facility] [place] [facility] Infectious Disease [phone] [facility] Course: Ethan [name] is a 58 y.o. year old male with history of     NIDDM type II , HLD who presented to the ED due to right foot wound of 1 month duration. Found to have severe right diabetic necrotizing  foot  infection with acute OM of 4th and 5th digits with septic arthritis.  started on broad-spectrum antibiotic after sending blood culture.    Podiatric team consulted and underwent  incision and debridement of the wound, partial fourth, fifth ray amputation with wound VAC placement on 10/14.  OR culture grew group B streptococcus.  patient was treated with vancomycin, Zosyn while in the hospital.  ID consulted and antibiotic de-escalated to amoxicillin 1 g 3 times daily for 2 weeks on discharge.  Patient was treated with basal bolus insulin for hyperglycemia and blood sugar was controlled.  Podiatric follow-up done and treated wound VAC changed.  PT evaluated and recommended wheelchair, bedside commode.  Family will buy Rolator.   Podiatry will follow-up and sign home health services still can establish PCP.  Ambulatory referral to PCP, endocrinology ordered.  Already has appointment for follow-up with podiatry, ID.  Assessment & Plan Septic arthritis    (CMD) Acute necrotizing right diabetic foot infection with septic arthritis Osteomyelitis of fifth toe of right foot    (CMD) Osteomyelitis of fourth toe of right foot    (CMD) -Right foot wound with associated pain that developed about 1 month ago.  -10/13 XR  foot: Erosions of 4th phalanx with swelling and soft tissue gas concerning for necrotizing infection. Findings concerning for acute osteomyelitis and associated septic arthritis at the interposed joints.  - 10/14 CT: re-demonstrated findings as above but in both 4th and 5th digits. Likely associated cellulitis and myositis of the forefoot.  - 10/14 Podiatry consulted status post  incision and debridement,right partial 4th and 5th ray amputation with application of wound vac. Intra-op cultures of right partial 4/5 rays and residual bone right foot taken. Recommend ID consult pending any positive results for antibiotic guidance. Transferred with VSS and vascular status intact to foot.  - has elevated  inflammatory markers with uncontrolled  diabetes - blood culture no growth,  wound culture group B streptococcus - SP clindamycin 48 hours - Patient was treated with Zosyn, vancomycin while in hospital, antibiotic de-escalated to amoxicillin 1 g 3 times daily for total 2 weeks with EOT 11/3 as per ID - has follow-up appointment with ID on 11/4 , can extend antibiotic course if needed -  podiatry has been doing wound VAC change, changed before discharge - case manager to arrange home health services for wound VAC change  2-3 times a week - patient does not have PCP, podiatry agreed to sign home health services paper till patient can establish pcp - PT evaluated and recommended wheelchair, bedside commode, RW - weightbearing as per  podiatry -Podiatry wrote note for his job   Type 2 diabetes mellitus with hyperglycemia, without long-term current use of insulin    (CMD)  patient has diabetic retinopathy and used to follow-up with ophthalmology but noncompliant, used to get injection before  hemoglobin A1c 11.5, presented with hyperglycemia with right diabetic foot infection   patient was treated with Lantus along with lispro while in the hospital and blood sugar remained controlled  family reported patient is noncompliant and concerned about short acting insulin frequency  being discharged on Lantus 20 units at bedtime, metformin  500 mg twice daily  all diabetic supplies sent  pharmacy taught about insulin injection  recommended to monitor blood sugar at least daily  ambulatory referral to endocrinology ordered for follow-up  will benefit from adding low-dose ACE inhibitor for renal protection on follow-up  aspirin for  primary prevention  advised to make an appointment with ophthalmology, patient has diabetic retinopathy    Hyperlipidemia  LDL 74  being discharged on Lipitor 40 mg at bedtime   Procedure(s): (U8+) AMPUTATION TOE RAY APPLICATION OR CHANGE OF WOUND VAC         Wound / Incision Assessment: Refer to Chart Review and Media Tab for images if available.  Wound 12/16/23 Diabetic Ulcer Toe D5, fifth Anterior;Right (Active)  Date First Assessed/Time First Assessed: 12/16/23 1151   Pre-Existing Wound: Yes  Primary Wound Type: Diabetic Ulcer  Location: Toe D5, fifth  Wound Location Orientation: Anterior;Right  Wound Description (Comments): One incision for amputation of 4th...     Negative Pressure Wound Therapy Foot Anterior;Right (Active)  Placement Date/Time: 12/16/23 1158   Inserted by: dr halford  Wound Type: Diabetic foot ulcer  Location: (c) Foot  Wound Location Orientation: Anterior;Right    Vital Sign Range:  Temp:  [98.3 F (36.8 C)-98.4 F (36.9 C)] 98.3 F (36.8 C) Heart Rate:  [67-68] 67 Resp:  [16-18] 16 BP: (120-130)/(60-69) 126/60 constitutional: Average built  male lying comfortably in bed not in acute distress HEENT: No pallor, no icterus Chest: Normal expansion, no chest wall tenderness Respiratory: Bilateral adequate air entry, no crackles  or wheezes Cardiac: RRR, normal S1, S2, no M/R/G Abdomen: Soft, nondistended, non tender Extremities:    wound VAC on the right foot , proximal swelling and slight redness resolved Neurological: Alert, awake, oriented x3,  no focal weakness      Discharge Medications     New Medications      Sig Disp Refill Start End  amoxicillin 500 mg capsule Commonly known as: AMOXIL  Tome 2 cpsulas (1,000 mg en total) por va oral 3 (tres) veces al da por [address]. (Take 2 capsules (1,000 mg total) by mouth 3 (three) times a day for 14 days.)  84 capsule  0     aspirin 81 mg chewable tablet  Mastique 1 tableta (81 mg en total) diariamente. (Chew 1 tablet (81 mg total) daily.)  90 tablet  0     * diabetic supplies, miscellan. Misc  Glucometer - Preferred brand, Daily, Use meter to check blood sugar 4 times a day.  Please dispense based on availability, insurance coverage, and patient  preference.  ICD-10 Code: Type 2 - E11.9  1 each  0     * diabetic supplies, miscellan. Misc  Lancets, Use 1 lancet to lance skin to check blood sugar 4 times daily.  Please dispense based on availability, insurance coverage, and patient preference.  ICD-10 Code: Type 2 - E11.9  200 each  5     * diabetic supplies, miscellan. Misc  Test Strips - Appropriate for meter, Use 1 strip to check blood sugar 4 times a day. Please dispense based on availability, insurance coverage, and patient preference.  ICD-10 Code: Type 2 - E11.9  200 each  5     * diabetic supplies, miscellan. Misc  Pen Needles: 6 mm, Inject insulin as directed ICD-10 Code: Diabetes Diagnosis Code: Type 2 - E11.9  200 each  5     Lantus Solostar U-100 Insulin 100 unit/mL (3 mL) pen Generic drug: insulin glargine  Inyecte 20 unidades debajo de la piel antes de acostarse. (Inject 20 Units under the skin at bedtime.)  6 mL  3     metFORMIN  500 mg 24 hr tablet Commonly known as: GLUCOPHAGE -XR Replaces: metFORMIN  500 mg tablet  Take 1 tablet (500 mg total) by mouth 2 (two) times a day.  60 tablet  3        * * There are duplicate medications prescribed to the patient          Modified Medications      Sig Disp Refill Start End  atorvastatin  40 mg tablet Commonly known as: LIPITOR What changed: See the new instructions.  Tome 1 tableta (40 mg en total) por va oral antes de acostarse. (Take 1 tablet (40 mg total) by mouth at bedtime.)  90 tablet  3         Stopped Medications    Floranex 100 million cell packet Generic drug: Lactobacillus acidoph-L.bulgar   glipiZIDE  5 mg tablet Commonly known as: GLUCOTROL    ketoconazole 2 % cream Commonly known as: NIZORAL   metFORMIN  500 mg tablet Commonly known as: GLUCOPHAGE  Replaced by: metFORMIN  500 mg 24 hr tablet   Onglyza 5 mg tablet Generic drug: sAXagliptin        Discharge Orders     Bedside Commode     Details:    Commode Type:  3-in-1   Height (in inches): 1.651 m (5' 5)   Weight (in lbs.): 93 kg (205 lb)   Length of need: Lifetime  Discharge Diet (specify)     Details:    Diet type: Consistent Carbohydrate   Carbohydrate Restriction: Med (45-60 gm/meal)   Full Code     Lifting Limits:     Details:    Lifting Limits: No lifting limits   Wheelchair     Details:    Height (in inches): 1.651 m (5' 5)   Weight (in lbs.): 93 kg (205 lb)   Wheelchair: Standard   Wheelchair Options:  Elevated Leg Rests Removeable Arms     Right, Left, Bilateral: Bilateral   Seat Width: 20   Cushion Type: Jay Basic (foam)   Length of need: Lifetime   Ambulatory referral to PCP     Comments: Establish PCP   Ambulatory referral to Endocrinology     Comments: Uncontrolled DM type II         Lab Results  Component Value Date/Time   HGB 13.1 (L) 12/22/2023 03:58 AM   HCT 38.7 (L) 12/22/2023 03:58 AM   WBC 10.40 12/22/2023 03:58 AM   PLT 364 12/22/2023 03:58 AM   Lab Results  Component Value Date/Time   NA 136 12/22/2023 03:58 AM   K 4.1 12/22/2023 03:58 AM   CREATININE 0.92 12/22/2023 03:58 AM   BUN 15 12/22/2023 03:58 AM   GLUCOSE 148 (H) 12/22/2023 03:58 AM    Pertinent Imaging: CT Foot Right WO Contrast  Final Result by Glendia Alm Simas, MD (10/14 1728)  CT FOOT RIGHT WITHOUT CONTRAST, 12/16/2023 12:13 AM    INDICATION: sot tissue gas   COMPARISON: Same day foot radiographs    TECHNIQUE: Thin-section images of the right foot were obtained without   intravenous contrast. Supplemental 2D reformatted images were generated   and reviewed as needed.    All CT scans at [facility] and [facility]   Imaging are performed using radiation dose optimization techniques as   appropriate to a performed exam, including but not limited to one or more   of the following: automatic exposure control, adjustment of the mA and/or   kV according to patient size, use of iterative  reconstruction technique.   In addition, our institution participates in a radiation dose monitoring   program to optimize patient radiation exposure.    FINDINGS:  *  No acute fracture.  *  Soft tissue ulceration along the plantar aspect of the forefoot   adjacent to the fourth digit.  *  Redemonstrated osseous erosions of the fourth metatarsal head, fourth   proximal phalanx, fourth middle phalanx, and base of the fourth distal   phalanx, compatible with acute osteomyelitis and associated septic   arthritis of the fourth MTP, PIP, and DIP joints.  *  Subtle osseous erosions of the head of the fifth metatarsal is   compatible with acute osteomyelitis.  *  Scattered soft tissue gas adjacent to the sites of erosive changes are   suspicious for necrotizing infection. No proximal extension of the soft   tissue gas into the midfoot or ankle.  *  Additional osseous erosions along the medial cortex of the fifth   proximal phalanx base.   *  Chronic appearing erosion and cortical offset of the first metatarsal   with partial fusion across the joint, likely sequela of chronic   osteomyelitis.  *  Chronic appearing erosions involving the second and third metatarsal   heads, compatible with sequelae of chronic osteomyelitis.  *  Redemonstrated midfoot collapse and fragmentation, compatible with  Charcot arthropathy.  *  Remote coronally oriented fracture of the posterior process of the   talus (Stieda process).  *  Well-corticated ossific bodies just distal to the medial malleolus,   likely sequelae of remote trauma.  *  Remodeling of the anterior process of the calcaneus consistent with   remote fracture.  *  Diffuse soft tissue reticulation and skin thickening about the forefoot   likely reflects adjacent cellulitis.   *  Diffuse soft tissue reticulation and edema throughout the lower   extremity and foot, likely reflecting cellulitis or sequela of the   venolymphatic insufficiency.  *   No definite drainable fluid collection.  *  Vascular calcifications.   *  Osteopenia.  *  Calcaneal enthesopathy.    IMPRESSION:  1.  Soft tissue ulceration along the plantar aspect of the forefoot with   underlying soft tissue gas, concerning for necrotizing infection. No   proximal extension of the soft tissue gas into the midfoot.  2.  Redemonstrated findings compatible with acute osteomyelitis involving   the fourth metatarsal head, proximal phalanx, middle phalanx, and base of   the distal phalanx with associated septic arthritis of the interposed   joints.  3.  Subtle osseous erosions of the head of the fifth metatarsal,   compatible with acute osteomyelitis.  4.  Likely associated cellulitis and myositis of the forefoot.  5.  No definite drainable fluid collection.      XR Foot Minimum 3 Views Right  Final Result by Glendia Alm Simas, MD (10/13 2249)  X-RAY FOOT RIGHT (3+ VIEWS), 12/15/2023 9:31 PM    INDICATION: right foot deep wound infection   COMPARISON: Right foot x-rays 11/24/2016 and MR right foot 11/27/2016    IMPRESSION:  1.  Erosions of the fourth proximal phalanx, fourth middle phalanx, and   base of the fourth distal phalanx. Erosions of the fourth metatarsal head.   Findings concerning for acute osteomyelitis and associated septic   arthritis at the interposed joints.   2.  Soft tissue swelling and soft tissue gas centered over the base of the   fourth toe, concerning for necrotizing infection.   3.  Chronic appearing erosions around the first metatarsophalangeal joint   with fusion across the joint. Overall, there is shortening of the first   toe. The remodeled, sclerotic bone suggests these findings are sequela of   chronic infection.  4.  Remodeling around the second MTP joint and fusion of the third DIP   joint. These findings may also relate to prior infection.  5.  Interval midfoot collapse compatible with Charcot arthropathy.  6.  Osteopenia.  7.   Calcaneal enthesopathy.        Electronically signed by: Gevena Amelie Friedman, MD 12/22/2023 12:31 PM   Time spent on discharge: 56 minutes  *Some images could not be shown.

## 2023-12-23 ENCOUNTER — Telehealth: Payer: Self-pay | Admitting: *Deleted

## 2023-12-23 NOTE — Transitions of Care (Post Inpatient/ED Visit) (Signed)
   12/23/2023  Name: Ethan [name] MRN: [id number] DOB: 1966-01-26  Today's TOC FU Call Status: Today's TOC FU Call Status:: Unsuccessful Call (1st Attempt) Unsuccessful Call (1st Attempt) Date: 12/23/23  Attempted to reach the patient regarding the most recent Inpatient/ED visit.  Follow Up Plan: Additional outreach attempts will be made to reach the patient to complete the Transitions of Care (Post Inpatient/ED visit) call.   Cathlean Headland BSN RN [facility] Health Care Management Coordinator [email or web address] Direct Dial: [phone]  Fax: [phone] Website: [email or web address]

## 2023-12-24 ENCOUNTER — Encounter (INDEPENDENT_AMBULATORY_CARE_PROVIDER_SITE_OTHER): Payer: Self-pay | Admitting: Ophthalmology
